# Patient Record
Sex: Female | Born: 1980 | Race: White | Hispanic: No | Marital: Married | Smoking: Never smoker
Health system: Southern US, Community
[De-identification: ages and names within clinical notes are randomized; demographics above are authoritative.]

## PROBLEM LIST (undated history)

## (undated) DIAGNOSIS — J40 Bronchitis, not specified as acute or chronic: Secondary | ICD-10-CM

## (undated) DIAGNOSIS — E039 Hypothyroidism, unspecified: Secondary | ICD-10-CM

## (undated) HISTORY — PX: TONSILLECTOMY: SUR1361

## (undated) HISTORY — PX: WISDOM TOOTH EXTRACTION: SHX21

---

## 2004-01-05 ENCOUNTER — Encounter: Payer: Self-pay | Admitting: Pulmonary Disease

## 2008-09-29 ENCOUNTER — Ambulatory Visit (HOSPITAL_COMMUNITY): Admission: RE | Admit: 2008-09-29 | Discharge: 2008-09-29 | Payer: Self-pay | Admitting: Obstetrics and Gynecology

## 2008-11-03 ENCOUNTER — Ambulatory Visit: Payer: Self-pay | Admitting: Pulmonary Disease

## 2008-11-03 DIAGNOSIS — J45909 Unspecified asthma, uncomplicated: Secondary | ICD-10-CM | POA: Insufficient documentation

## 2008-11-03 DIAGNOSIS — J309 Allergic rhinitis, unspecified: Secondary | ICD-10-CM | POA: Insufficient documentation

## 2008-11-10 ENCOUNTER — Telehealth: Payer: Self-pay | Admitting: Pulmonary Disease

## 2008-11-14 ENCOUNTER — Telehealth (INDEPENDENT_AMBULATORY_CARE_PROVIDER_SITE_OTHER): Payer: Self-pay | Admitting: *Deleted

## 2008-11-15 ENCOUNTER — Ambulatory Visit: Payer: Self-pay | Admitting: Internal Medicine

## 2008-11-15 ENCOUNTER — Encounter: Payer: Self-pay | Admitting: Adult Health

## 2008-12-04 ENCOUNTER — Ambulatory Visit: Payer: Self-pay | Admitting: Pulmonary Disease

## 2009-01-31 ENCOUNTER — Ambulatory Visit: Payer: Self-pay | Admitting: Pulmonary Disease

## 2009-04-11 ENCOUNTER — Ambulatory Visit: Payer: Self-pay | Admitting: Pulmonary Disease

## 2009-04-11 DIAGNOSIS — R05 Cough: Secondary | ICD-10-CM | POA: Insufficient documentation

## 2009-05-12 ENCOUNTER — Inpatient Hospital Stay (HOSPITAL_COMMUNITY): Admission: AD | Admit: 2009-05-12 | Discharge: 2009-05-14 | Payer: Self-pay | Admitting: Obstetrics and Gynecology

## 2009-05-12 ENCOUNTER — Encounter (INDEPENDENT_AMBULATORY_CARE_PROVIDER_SITE_OTHER): Payer: Self-pay | Admitting: Obstetrics and Gynecology

## 2009-05-18 ENCOUNTER — Ambulatory Visit: Admission: RE | Admit: 2009-05-18 | Discharge: 2009-05-18 | Payer: Self-pay | Admitting: Obstetrics and Gynecology

## 2009-07-11 ENCOUNTER — Ambulatory Visit: Payer: Self-pay | Admitting: Pulmonary Disease

## 2009-07-16 ENCOUNTER — Telehealth: Payer: Self-pay | Admitting: Pulmonary Disease

## 2009-07-31 ENCOUNTER — Ambulatory Visit: Payer: Self-pay | Admitting: Pulmonary Disease

## 2009-10-19 ENCOUNTER — Ambulatory Visit: Payer: Self-pay | Admitting: Physician Assistant

## 2009-10-19 ENCOUNTER — Inpatient Hospital Stay (HOSPITAL_COMMUNITY): Admission: AD | Admit: 2009-10-19 | Discharge: 2009-10-19 | Payer: Self-pay | Admitting: Obstetrics and Gynecology

## 2011-03-10 LAB — CBC
Hemoglobin: 10.1 g/dL — ABNORMAL LOW (ref 12.0–15.0)
Hemoglobin: 14.1 g/dL (ref 12.0–15.0)
Platelets: 114 10*3/uL — ABNORMAL LOW (ref 150–400)
Platelets: 94 10*3/uL — ABNORMAL LOW (ref 150–400)
RBC: 2.9 MIL/uL — ABNORMAL LOW (ref 3.87–5.11)
WBC: 8.4 10*3/uL (ref 4.0–10.5)
WBC: 9.1 10*3/uL (ref 4.0–10.5)

## 2011-04-15 NOTE — Discharge Summary (Signed)
[name], [name]            ACCOUNT NO.:  [id number]   MEDICAL RECORD NO.:  [id number]          PATIENT TYPE:  INP   LOCATION:  9114                          FACILITY:  [facility]   PHYSICIAN:  Malachi Pro. Ambrose Mantle, M.D. DATE OF BIRTH:  04/12/1981   DATE OF ADMISSION:  05/12/2009  DATE OF DISCHARGE:  05/14/2009                               DISCHARGE SUMMARY   HISTORY:  A 30 year old white married female, para 0, gravida 1, EDC on  May 18, 2009, admitted with ruptured membranes and active labor.  Blood  group and type O positive, negative antibody, nonreactive serology,  rubella immune, hepatitis B surface antigen negative, HIV negative, GC  and chlamydia negative, quad screen negative, 1-hour Glucola 79, and  group B strep positive.  Vaginal ultrasound on October 17, 2008, crown-  rump length 2.71 cm, 9 weeks 4 days, EDC on May 18, 2009.  Ultrasound  on December 25, 2008, compatible with date, normal anatomy.  Prenatal  care was uncomplicated except for asthmatic bronchitis.  The patient  awoke at approximately 2 a.m., went to the restroom and had spontaneous  rupture of membranes, she began contracting, and came here for  evaluation.   PAST MEDICAL HISTORY:  No known allergies.   OPERATIONS:  In 1989 a T and A.  In 2001, wisdom teeth extracted.   ILLNESSES:  Depression.   SOCIAL HISTORY:  Alcohol, tobacco, and drugs none.   FAMILY HISTORY:  Paternal grandmother with high blood pressure and heart  disease.  Maternal grandmother with thyroid problem.  Maternal  grandfather with diabetes.  Maternal grandfather with brain cancer.   On admission, her vital signs were normal.  Heart and lungs were normal.  The abdomen was soft.  Fundal height 38 cm on May 02, 2009, fetal heart  tones were normal.  Cervix was 8 cm at 5:10 a.m., 100% vertex, at a 0  station.  Because of a late admission with the cervix already dilated to  8 cm and positive group B strep, the patient was treated with  IV  ampicillin.  She received an epidural and progressed to full dilatation.  She made some slow steady progress with pushing, but there was  bradycardia with each contraction  with 6 cm of caput showing, a kiwi  self-controlled vacuum was applied and with 3 contractions and no pop  offs, a living 6 pounds 11 ounces female infant, Apgars of 8 and 1 and 9  at 5 minutes, was delivered LOA through a tight nuchal cord.  Placenta  was slow to release in a large portion that appeared calcified or  infarct.  The second-degree midline laceration was repaired with 3-0  Vicryl, but the repair was interrupted several times to massage the  uterus in order to try to get a field unencumbered by blood.  In spite  of multiple evacuations of the uterus and continued to bleed, so  Hemabate was considered, but not administered because of her asthma.  Methergine 0.2 mg IM was given.  Rectal was negative.  Bleeding appeared  except end of the procedure.  All the bleeding was originating  from the  uterus.  Estimated blood loss was 1000-1200 mL.   Postpartum, the patient's blood pressure intermittently rose above  140/90, but for the most part remained completely normal.  She did not  have any headache.  On the second postpartum day, she was ready for  discharge.  The initial hemoglobin was 14.1, hematocrit 39.2, white  count 91,100, and platelet count of 114,000.  Followup hemoglobin 10.1,  platelet count 94,000, and RPR nonreactive.   FINAL DIAGNOSES:  Intrauterine pregnancy at 39 weeks, delivered left  occipitoanterior, thrombocytopenia.   OPERATION:  Vacuum-assisted vaginal delivery left occipitoanterior,  repair of second-degree midline laceration, intermittent hypertension.   The patient is given our discharge instruction booklet.  She declines  analgesics for discharge.  She is to continue her prenatal vitamins,  Protonix, Zyrtec, and Robitussin.  She is also advised to take ferrous  sulfate 325 mg  twice a day, Motrin 200 mg at home as needed.  She is  also advised to have a repeat CBC at 6 weeks checkup and to take her  blood pressure herself and call if it is consistently elevated greater  than 140/90, especially if she has associated headache.      Malachi Pro. Ambrose Mantle, M.D.  Electronically Signed     TFH/MEDQ  D:  05/14/2009  T:  05/14/2009  Job:  [id number]

## 2011-09-03 ENCOUNTER — Encounter: Payer: Self-pay | Admitting: Pulmonary Disease

## 2011-09-04 ENCOUNTER — Ambulatory Visit: Payer: 59

## 2011-09-04 ENCOUNTER — Encounter: Payer: Self-pay | Admitting: Pulmonary Disease

## 2011-09-04 ENCOUNTER — Ambulatory Visit (INDEPENDENT_AMBULATORY_CARE_PROVIDER_SITE_OTHER): Payer: 59 | Admitting: Pulmonary Disease

## 2011-09-04 DIAGNOSIS — J45909 Unspecified asthma, uncomplicated: Secondary | ICD-10-CM

## 2011-09-04 DIAGNOSIS — R05 Cough: Secondary | ICD-10-CM

## 2011-09-04 DIAGNOSIS — Z0000 Encounter for general adult medical examination without abnormal findings: Secondary | ICD-10-CM

## 2011-09-04 MED ORDER — ALBUTEROL SULFATE HFA 108 (90 BASE) MCG/ACT IN AERS: 2.0000 | INHALATION_SPRAY | Freq: Four times a day (QID) | RESPIRATORY_TRACT | Status: AC | PRN

## 2011-09-04 MED ORDER — FLUTICASONE-SALMETEROL 250-50 MCG/DOSE IN AEPB: 1.0000 | INHALATION_SPRAY | Freq: Two times a day (BID) | RESPIRATORY_TRACT | Status: DC

## 2011-09-04 NOTE — Progress Notes (Signed)
  Subjective:    Patient ID: [name], female    DOB: 22-Jun-1981, 30 y.o.   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  spirometry 12/09 >> no obstruction, restriction ? related to pregnancy.  she had a similar episode in 2007 when she was evaluated at [facility] with-CT scans & lung function testing. Allergy testing >> to pollen & grass.  Chronic cough has responded somewhat to protonix & claritin - tussionex as needed only.    July 31, 2009  Stopped milk & chocolate 2 weeks ago because baby was 'fussy' - cough disappeared & breathing improved. Resumed chocolate intake without issues, no h/o lactose intolerance as a child, moved to new home in 10/09 with carpets. Off protonix, GERD better  09/04/11 60yr FU C/o  constant cough . Seems to be worse at fall time.has had a course of abx and predisone dosepak. RAST neg   Review of Systems Pt denies any significant  nasal congestion or excess secretions, fever, chills, sweats, unintended wt loss, pleuritic or exertional cp, orthopnea pnd or leg swelling.  Pt also denies any obvious fluctuation in symptoms with weather or environmental change or other alleviating or aggravating factors.    Pt denies any increase in rescue therapy over baseline, denies waking up needing it or having early am exacerbations or coughing/wheezing/ or dyspnea      Objective:   Physical Exam Gen. Pleasant, well-nourished, in no distress ENT - no lesions, no post nasal drip Neck: No JVD, no thyromegaly, no carotid bruits Lungs: no use of accessory muscles, no dullness to percussion, clear without rales or rhonchi  Cardiovascular: Rhythm regular, heart sounds  normal, no murmurs or gallops, no peripheral edema Musculoskeletal: No deformities, no cyanosis or clubbing         Assessment & Plan:

## 2011-09-04 NOTE — Patient Instructions (Addendum)
Allergy testing - RAST Get back on advair 250/50 - sample & Rx - RINSE mouth after use Albuterol 2 puffs MDI as needed only for RESCUE Cal for prednisone Rx if no better in 1 week or impedes work

## 2011-09-05 LAB — ALLERGY FULL PROFILE
Allergen,Goose feathers, e70: <0.1 kU/L (ref <0.35)
Alternaria Alternata: <0.1 kU/L (ref <0.35)
Bahia Grass: <0.1 kU/L (ref <0.35)
Box Elder IgE: <0.1 kU/L (ref <0.35)
Cat Dander: <0.1 kU/L (ref <0.35)
Common Ragweed: <0.1 kU/L (ref <0.35)
D. farinae: <0.1 kU/L (ref <0.35)
Elm IgE: <0.1 kU/L (ref <0.35)
Helminthosporium halodes: <0.1 kU/L (ref <0.35)
House Dust Hollister: <0.1 kU/L (ref <0.35)
IgE (Immunoglobulin E), Serum: <1.5 IU/mL (ref 0.0–180.0)
Lamb's Quarters: <0.1 kU/L (ref <0.35)
Sycamore Tree: <0.1 kU/L (ref <0.35)

## 2011-09-06 ENCOUNTER — Encounter: Payer: Self-pay | Admitting: Pulmonary Disease

## 2011-09-06 NOTE — Assessment & Plan Note (Signed)
Get back on advair 250/50 - sample & Rx - RINSE mouth after use Albuterol 2 puffs MDI as needed only for RESCUE Cal for prednisone Rx if no better in 1 week or impedes work

## 2011-09-08 ENCOUNTER — Telehealth: Payer: Self-pay | Admitting: Pulmonary Disease

## 2011-09-08 MED ORDER — FLUTICASONE-SALMETEROL 250-50 MCG/DOSE IN AEPB: 1.0000 | INHALATION_SPRAY | Freq: Two times a day (BID) | RESPIRATORY_TRACT | Status: DC

## 2011-09-08 NOTE — Telephone Encounter (Signed)
I spoke with pt and she states she needed her advair inhaler sent to [facility] outpatient pharmacy #90 day supply. I advised will send rx and nothing further was needed

## 2011-09-09 NOTE — Progress Notes (Signed)
Quick Note:  Spoke with pt and notified of results per RA. She verbalized understanding. ______

## 2011-10-03 ENCOUNTER — Ambulatory Visit: Payer: 59 | Admitting: Adult Health

## 2011-12-17 ENCOUNTER — Encounter: Payer: Self-pay | Admitting: Adult Health

## 2011-12-17 ENCOUNTER — Ambulatory Visit (INDEPENDENT_AMBULATORY_CARE_PROVIDER_SITE_OTHER): Payer: 59 | Admitting: Adult Health

## 2011-12-17 VITALS — BP 100/72 | HR 80 | Temp 96.9°F | Ht 66.0 in | Wt 177.2 lb

## 2011-12-17 DIAGNOSIS — J45909 Unspecified asthma, uncomplicated: Secondary | ICD-10-CM

## 2011-12-17 MED ORDER — FLUTICASONE-SALMETEROL 100-50 MCG/DOSE IN AEPB: 1.0000 | INHALATION_SPRAY | Freq: Two times a day (BID) | RESPIRATORY_TRACT | Status: DC

## 2011-12-17 NOTE — Patient Instructions (Signed)
May decrease Advair 100/50 1 puff Twice daily   follow up Dr. Vassie Loll  In 4 months and As needed

## 2011-12-17 NOTE — Progress Notes (Signed)
  Subjective:    Patient ID: [name], female    DOB: 12/25/1980, 31 y.o.   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  spirometry 12/09 >> no obstruction, restriction ? related to pregnancy.  she had a similar episode in 2007 when she was evaluated at [facility] with-CT scans & lung function testing. Allergy testing >> to pollen & grass.  Chronic cough has responded somewhat to protonix & claritin - tussionex as needed only.    July 31, 2009  Stopped milk & chocolate 2 weeks ago because baby was 'fussy' - cough disappeared & breathing improved. Resumed chocolate intake without issues, no h/o lactose intolerance as a child, moved to new home in 10/09 with carpets. Off protonix, GERD better  09/04/11 73yr FU C/o  constant cough . Seems to be worse at fall time.has had a course of abx and predisone dosepak. RAST neg >no changes   12/17/2011 Follow up  Pt returns for follow up. Doing very well on Advair . No flare since last ov. No SABA use.  No ER or hospital visits.  No increased cough or dyspnea. No wheeizng.     Review of Systems  Constitutional:   No  weight loss, night sweats,  Fevers, chills, fatigue, or  lassitude.  HEENT:   No headaches,  Difficulty swallowing,  Tooth/dental problems, or  Sore throat,                No sneezing, itching, ear ache, nasal congestion, post nasal drip,   CV:  No chest pain,  Orthopnea, PND, swelling in lower extremities, anasarca, dizziness, palpitations, syncope.   GI  No heartburn, indigestion, abdominal pain, nausea, vomiting, diarrhea, change in bowel habits, loss of appetite, bloody stools.   Resp: No shortness of breath with exertion or at rest.  No excess mucus, no productive cough,  No non-productive cough,  No coughing up of blood.  No change in color of mucus.  No wheezing.  No chest wall deformity  Skin: no rash or lesions.  GU: no dysuria,  change in color of urine, no urgency or frequency.  No flank pain, no hematuria   MS:  No joint pain or swelling.  No decreased range of motion.  No back pain.  Psych:  No change in mood or affect. No depression or anxiety.  No memory loss.        Objective:   Physical Exam  Gen. Pleasant, well-nourished, in no distress ENT - no lesions, no post nasal drip Neck: No JVD, no thyromegaly, no carotid bruits Lungs: no use of accessory muscles, no dullness to percussion, clear without rales or rhonchi  Cardiovascular: Rhythm regular, heart sounds  normal, no murmurs or gallops, no peripheral edema Musculoskeletal: No deformities, no cyanosis or clubbing         Assessment & Plan:

## 2011-12-17 NOTE — Assessment & Plan Note (Signed)
Compensated on present regimen Will step down therapy  Plan;  Decrease Advair 100/50 1 puff Twice daily   follow up Dr. Vassie Loll  In 4 months

## 2012-03-07 ENCOUNTER — Emergency Department (HOSPITAL_COMMUNITY)
Admission: EM | Admit: 2012-03-07 | Discharge: 2012-03-07 | Disposition: A | Discharge status: Home / Self Care | Payer: 59 | Source: Home / Self Care | Attending: Emergency Medicine | Admitting: Emergency Medicine

## 2012-03-07 ENCOUNTER — Encounter (HOSPITAL_COMMUNITY): Payer: Self-pay | Admitting: *Deleted

## 2012-03-07 DIAGNOSIS — J45909 Unspecified asthma, uncomplicated: Secondary | ICD-10-CM

## 2012-03-07 DIAGNOSIS — J329 Chronic sinusitis, unspecified: Secondary | ICD-10-CM

## 2012-03-07 HISTORY — DX: Bronchitis, not specified as acute or chronic: J40

## 2012-03-07 MED ORDER — FEXOFENADINE-PSEUDOEPHED ER 60-120 MG PO TB12: 1.0000 | ORAL_TABLET | Freq: Two times a day (BID) | ORAL | Status: DC

## 2012-03-07 MED ORDER — PREDNISONE 20 MG PO TABS: 40.0000 mg | ORAL_TABLET | Freq: Every day | ORAL | Status: AC

## 2012-03-07 MED ORDER — AMOXICILLIN 500 MG PO CAPS: 500.0000 mg | ORAL_CAPSULE | Freq: Three times a day (TID) | ORAL | Status: AC

## 2012-03-07 NOTE — Discharge Instructions (Signed)
  As discussed start with antihistamines and prednisone and continue with albuterol use as needed if no improvement within the next 4872 hours and you persists with sinus pressure and congestion start with provided antibiotic   Asthma, Adult Asthma is caused by narrowing of the air passages in the lungs. It may be triggered by pollen, dust, animal dander, molds, some foods, respiratory infections, exposure to smoke, exercise, emotional stress or other allergens (things that cause allergic reactions or allergies). Repeat attacks are common. HOME CARE INSTRUCTIONS   Use prescription medications as ordered by your caregiver.   Avoid pollen, dust, animal dander, molds, smoke and other things that cause attacks at home and at work.   You may have fewer attacks if you decrease dust in your home. Electrostatic air cleaners may help.   It may help to replace your pillows or mattress with materials less likely to cause allergies.   Talk to your caregiver about an action plan for managing asthma attacks at home, including, the use of a peak flow meter which measures the severity of your asthma attack. An action plan can help minimize or stop the attack without having to seek medical care.   If you are not on a fluid restriction, drink 8 to 10 glasses of water each day.   Always have a plan prepared for seeking medical attention, including, calling your physician, accessing local emergency care, and calling 911 (in the [place] for a severe attack.   Discuss possible exercise routines with your caregiver.   If animal dander is the cause of asthma, you may need to get rid of pets.  SEEK MEDICAL CARE IF:   You have wheezing and shortness of breath even if taking medicine to prevent attacks.   You have muscle aches, chest pain or thickening of sputum.   Your sputum changes from clear or white to yellow, green, gray, or bloody.   You have any problems that may be related to the medicine you are taking  (such as a rash, itching, swelling or trouble breathing).  SEEK IMMEDIATE MEDICAL CARE IF:   Your usual medicines do not stop your wheezing or there is increased coughing and/or shortness of breath.   You have increased difficulty breathing.   You have a fever.  MAKE SURE YOU:   Understand these instructions.   Will watch your condition.   Will get help right away if you are not doing well or get worse.  Document Released: 11/17/2005 Document Revised: 11/06/2011 Document Reviewed: 07/05/2008 [facility] Patient Information 2012 [place], [place].

## 2012-03-07 NOTE — ED Provider Notes (Signed)
History     CSN: [id number]  Arrival date & time 03/07/12  1732   First MD Initiated Contact with Patient 03/07/12 1738      Chief Complaint  Patient presents with  . Nasal Congestion  . Cough  . Chills    (Consider location/radiation/quality/duration/timing/severity/associated sxs/prior treatment) HPI Comments: Since Thursday my symptoms have worsened, shortness of breath wheezing congestion cough and sinus pressure have been coughing up abundant phlegm and green-looking at times sinus pressure has been increasing. Have been using albuterol but wheezing and tightness keeps coming back.  Patient is a 31 y.o. female presenting with cough. The history is provided by the patient.  Cough This is a new problem. The current episode started more than 1 week ago. The problem occurs constantly. The problem has not changed since onset.The cough is productive of sputum. Associated symptoms include chills, rhinorrhea, sore throat, shortness of breath and wheezing. Pertinent negatives include no chest pain and no ear pain. She has tried nothing for the symptoms. The treatment provided no relief. Her past medical history is significant for asthma.    Past Medical History  Diagnosis Date  . Allergic rhinitis   . Asthma   . Bronchitis   . Tension headache     Past Surgical History  Procedure Date  . Wisdom tooth extraction   . Tonsillectomy     History reviewed. No pertinent family history.  History  Substance Use Topics  . Smoking status: Never Smoker   . Smokeless tobacco: Never Used  . Alcohol Use: No    OB History    Grav Para Term Preterm Abortions TAB SAB Ect Mult Living                  Review of Systems  Constitutional: Positive for fever, chills, appetite change and fatigue.  HENT: Positive for congestion, sore throat, rhinorrhea and sinus pressure. Negative for ear pain, trouble swallowing, neck pain, neck stiffness and voice change.   Respiratory: Positive for cough,  shortness of breath and wheezing.   Cardiovascular: Negative for chest pain.  Gastrointestinal: Negative for abdominal pain.  Skin: Negative for rash.    Allergies  Review of patient's allergies indicates no known allergies.  Home Medications   Current Outpatient Rx  Name Route Sig Dispense Refill  . ALBUTEROL SULFATE HFA 108 (90 BASE) MCG/ACT IN AERS Inhalation Inhale 2 puffs into the lungs every 6 (six) hours as needed for wheezing. 1 Inhaler 5  . CYCLOBENZAPRINE HCL 5 MG PO TABS Oral Take 5 mg by mouth 2 (two) times daily as needed. Patient takes this prn headache     . ESCITALOPRAM OXALATE 10 MG PO TABS Oral Take 10 mg by mouth daily.      Marland Kitchen FLUTICASONE-SALMETEROL 100-50 MCG/DOSE IN AEPB Inhalation Inhale 1 puff into the lungs 2 (two) times daily. 1 each 5  . ALEVE-D SINUS & COLD PO Oral Take by mouth.    . AMOXICILLIN 500 MG PO CAPS Oral Take 1 capsule (500 mg total) by mouth 3 (three) times daily. 30 capsule 0  . ETONOGESTREL-ETHINYL ESTRADIOL 0.12-0.015 MG/24HR VA RING Vaginal Place 1 each vaginally every 28 (twenty-eight) days. Insert vaginally and leave in place for 3 consecutive weeks, then remove for 1 week.    Marland Kitchen FEXOFENADINE-PSEUDOEPHED ER 60-120 MG PO TB12 Oral Take 1 tablet by mouth every 12 (twelve) hours. 15 tablet 0  . PREDNISONE 20 MG PO TABS Oral Take 2 tablets (40 mg total) by mouth daily.  2 tablets daily for 5 days 10 tablet 0    BP 120/79  Pulse 98  Temp(Src) 98.7 F (37.1 C) (Oral)  Resp 18  SpO2 99%  LMP 02/09/2012  Physical Exam  Nursing note and vitals reviewed. Constitutional: She appears well-developed and well-nourished.  HENT:  Head: Normocephalic.  Nose: Rhinorrhea present.  Mouth/Throat: Uvula is midline. Posterior oropharyngeal erythema present.  Eyes: Conjunctivae are normal. No scleral icterus.  Neck: Neck supple. No JVD present.  Cardiovascular: Normal rate.   Pulmonary/Chest: Effort normal. No respiratory distress. She has wheezes. She  has no rales. She exhibits no tenderness.  Abdominal: Soft. There is no tenderness.  Lymphadenopathy:    She has no cervical adenopathy.  Neurological: She is alert.  Skin: No rash noted. No erythema.    ED Course  Procedures (including critical care time)  Labs Reviewed - No data to display No results found.   1. RAD (reactive airway disease)   2. Sinusitis       MDM  Patient been expressing respiratory symptoms include cough congestion wheezing chills and sinus pressure since Thursday they have exacerbated but he been having the symptoms for about 7 days he is chronically on aspirin maintenance medication and is using albuterol as breakthrough        Jimmie Molly, MD 03/07/12 [phone]

## 2012-03-07 NOTE — ED Notes (Signed)
Pt with history bronchitis and asthma onset of congestion/cough/chills Thursday -aching all over -

## 2013-02-08 ENCOUNTER — Telehealth: Payer: Self-pay | Admitting: Pulmonary Disease

## 2013-02-08 NOTE — Telephone Encounter (Signed)
Pt return call. Please call back at [phone]. [name]

## 2013-02-08 NOTE — Telephone Encounter (Signed)
lmtcb x1 for pt. 

## 2013-02-08 NOTE — Telephone Encounter (Signed)
ATC patient, no answer [id number] 

## 2013-02-09 MED ORDER — FLUTICASONE-SALMETEROL 100-50 MCG/DOSE IN AEPB: 1.0000 | INHALATION_SPRAY | Freq: Two times a day (BID) | RESPIRATORY_TRACT | Status: DC

## 2013-02-09 NOTE — Telephone Encounter (Signed)
Patient returning call.

## 2013-02-09 NOTE — Telephone Encounter (Signed)
I spoke with pt and is aware will send in refill to last her until she comes in for her OV. Pt voiced her understanding. RX has been sent. Nothing further was needed

## 2013-02-09 NOTE — Telephone Encounter (Signed)
[facility] to get refill of advair - wants 90 days. Caller says she has faxed on 3 separate days for this. Hazel Sams

## 2013-02-24 ENCOUNTER — Encounter: Payer: Self-pay | Admitting: Adult Health

## 2013-02-24 ENCOUNTER — Ambulatory Visit (INDEPENDENT_AMBULATORY_CARE_PROVIDER_SITE_OTHER): Payer: 59 | Admitting: Adult Health

## 2013-02-24 VITALS — BP 108/62 | HR 75 | Temp 97.8°F | Ht 66.0 in | Wt 220.6 lb

## 2013-02-24 DIAGNOSIS — J45909 Unspecified asthma, uncomplicated: Secondary | ICD-10-CM

## 2013-02-24 NOTE — Assessment & Plan Note (Addendum)
Moderate persistent asthma with frequent exacerbations over the last year. Appears to have allergic rhinitis with mild flare  Plan  Increase Advair 250/50 1 puff Twice daily  , brush , rinse and gargle after use.  Begin Zyrtec 10mg  At bedtime  At least for next several weeks, then As needed   follow up Dr. Vassie Loll  In 6 months and As needed

## 2013-02-24 NOTE — Patient Instructions (Addendum)
Increase Advair 250/50 1 puff Twice daily  , brush , rinse and gargle after use.  Begin Zyrtec 10mg  At bedtime  At least for next several weeks, then As needed   follow up Dr. Vassie Loll  In 6 months and As needed

## 2013-02-24 NOTE — Progress Notes (Signed)
  Subjective:    Patient ID: [name], female    DOB: 07-29-1981, 32 y.o.   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  spirometry 12/09 >> no obstruction, restriction ? related to pregnancy.  she had a similar episode in 2007 when she was evaluated at [facility] with-CT scans & lung function testing. Allergy testing >> to pollen & grass.  Chronic cough has responded somewhat to protonix & claritin - tussionex as needed only.    July 31, 2009  Stopped milk & chocolate 2 weeks ago because baby was 'fussy' - cough disappeared & breathing improved. Resumed chocolate intake without issues, no h/o lactose intolerance as a child, moved to new home in 10/09 with carpets. Off protonix, GERD better  09/04/11 48yr FU C/o  constant cough . Seems to be worse at fall time.has had a course of abx and predisone dosepak. RAST neg >no changes   12/17/11  Follow up  Pt returns for follow up. Doing very well on Advair . No flare since last ov. No SABA use.  No ER or hospital visits.  No increased cough or dyspnea. No wheeizng.  >no changes   02/24/2013 Follow up  yearly follow up for med refills Has seen PCP twice this year for wheezing and received pred pak x2. Seen in urgent care x 1 with pred pack . >3 flares over last year.  Has 1 child at home.  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  Does have some nasal drainage at times.  No fever, chest pain , edema.  Increased SABA use during flares.  Baseline no SABA use .    Review of Systems  Constitutional:   No  weight loss, night sweats,  Fevers, chills, fatigue, or  lassitude.  HEENT:   No headaches,  Difficulty swallowing,  Tooth/dental problems, or  Sore throat,                No sneezing, itching, ear ache,  +nasal congestion, post nasal drip,   CV:  No chest pain,  Orthopnea, PND, swelling in lower extremities, anasarca, dizziness, palpitations, syncope.    GI  No heartburn, indigestion, abdominal pain, nausea, vomiting, diarrhea, change in bowel habits, loss of appetite, bloody stools.   Resp: No shortness of breath with exertion or at rest.  No excess mucus, no productive cough,  No non-productive cough,  No coughing up of blood.  No change in color of mucus.  No wheezing.  No chest wall deformity  Skin: no rash or lesions.  GU: no dysuria, change in color of urine, no urgency or frequency.  No flank pain, no hematuria   MS:  No joint pain or swelling.  No decreased range of motion.  No back pain.  Psych:  No change in mood or affect. No depression or anxiety.  No memory loss.        Objective:   Physical Exam  Gen. Pleasant, well-nourished, in no distress ENT - no lesions, no post nasal drip Neck: No JVD, no thyromegaly, no carotid bruits Lungs: no use of accessory muscles, no dullness to percussion, clear without rales or rhonchi  Cardiovascular: Rhythm regular, heart sounds  normal, no murmurs or gallops, no peripheral edema Musculoskeletal: No deformities, no cyanosis or clubbing         Assessment & Plan:

## 2013-02-25 ENCOUNTER — Ambulatory Visit: Payer: 59 | Admitting: Adult Health

## 2013-03-02 ENCOUNTER — Telehealth: Payer: Self-pay | Admitting: Adult Health

## 2013-03-02 MED ORDER — FLUTICASONE-SALMETEROL 250-50 MCG/DOSE IN AEPB: 1.0000 | INHALATION_SPRAY | Freq: Two times a day (BID) | RESPIRATORY_TRACT | Status: DC

## 2013-03-02 NOTE — Telephone Encounter (Signed)
rx called in. Carron Curie, CMA

## 2013-08-03 ENCOUNTER — Telehealth: Payer: Self-pay | Admitting: Pulmonary Disease

## 2013-08-03 NOTE — Telephone Encounter (Signed)
left messages for pt to call back to schedule follow up apt. No return calls back. Sent letter 08/03/13 ° °

## 2013-08-23 ENCOUNTER — Telehealth: Payer: Self-pay | Admitting: Pulmonary Disease

## 2013-08-23 MED ORDER — FLUTICASONE-SALMETEROL 250-50 MCG/DOSE IN AEPB: 1.0000 | INHALATION_SPRAY | Freq: Two times a day (BID) | RESPIRATORY_TRACT | Status: DC

## 2013-08-23 NOTE — Telephone Encounter (Signed)
ATC patient no answer Left detailed msg on VM as requested-- Rx has been eRx to [facility]

## 2013-10-10 ENCOUNTER — Encounter (INDEPENDENT_AMBULATORY_CARE_PROVIDER_SITE_OTHER): Payer: Self-pay

## 2013-10-10 ENCOUNTER — Encounter: Payer: Self-pay | Admitting: Pulmonary Disease

## 2013-10-10 ENCOUNTER — Ambulatory Visit (INDEPENDENT_AMBULATORY_CARE_PROVIDER_SITE_OTHER): Payer: 59 | Admitting: Pulmonary Disease

## 2013-10-10 VITALS — BP 108/66 | HR 79 | Ht 66.0 in | Wt 206.4 lb

## 2013-10-10 DIAGNOSIS — J45909 Unspecified asthma, uncomplicated: Secondary | ICD-10-CM

## 2013-10-10 DIAGNOSIS — J309 Allergic rhinitis, unspecified: Secondary | ICD-10-CM

## 2013-10-10 MED ORDER — AZITHROMYCIN 250 MG PO TABS: ORAL_TABLET | ORAL | Status: DC

## 2013-10-10 NOTE — Patient Instructions (Signed)
[facility] Rx to [facility] in [place] Claritin during spring & fall Spirometry next visit & can step down dose of advair Take [facility] brand decongestant x 1 week

## 2013-10-10 NOTE — Progress Notes (Signed)
  Subjective:    Patient ID: [name], female    DOB: 06/09/1981, 32 y.o.   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 , never smoker for FU of asthmatic bronchitis  Intially seen 11/03/08.  spirometry 12/09 >> no obstruction, restriction ? related to pregnancy.  she had a similar episode in 2007 when she was evaluated at [facility] with-CT scans & lung function testing.  Allergy testing >>  pollen & grass.  09/04/11 RAST neg  Chronic cough has responded somewhat to protonix & claritin - tussionex as needed only.   July 31, 2009  Stopped milk & chocolate 2 weeks ago because baby was 'fussy' - cough disappeared & breathing improved. Resumed chocolate intake without issues, no h/o lactose intolerance as a child, moved to new home in 10/09 with carpets. Off protonix, GERD better     10/10/2013 79m FU 2-3 flares in 2014 >3 flares over 2013  Chief Complaint  Patient presents with  . Follow-up    Pt reports her breathing is okay. C/o HA, runny nose, chest congestion, productive cough-green phlem, PND, watery/itchy eyes. She uses zyrtec daily.    Baseline no SABA use . No nocturnal wheezing or SABA use    Review of Systems neg for any significant sore throat, dysphagia, itching, sneezing, nasal congestion or excess/ purulent secretions, fever, chills, sweats, unintended wt loss, pleuritic or exertional cp, hempoptysis, orthopnea pnd or change in chronic leg swelling. Also denies presyncope, palpitations, heartburn, abdominal pain, nausea, vomiting, diarrhea or change in bowel or urinary habits, dysuria,hematuria, rash, arthralgias, visual complaints, headache, numbness weakness or ataxia.     Objective:   Physical Exam  Gen. Pleasant, well-nourished, in no distress ENT - no lesions, no post nasal drip Neck: No JVD, no thyromegaly, no carotid bruits Lungs: no use of accessory muscles, no dullness to percussion, clear without rales or rhonchi   Cardiovascular: Rhythm regular, heart sounds  normal, no murmurs or gallops, no peripheral edema Musculoskeletal: No deformities, no cyanosis or clubbing        Assessment & Plan:

## 2013-10-13 NOTE — Assessment & Plan Note (Signed)
ZPAK for acute bronchitis Spirometry next visit & can step down dose of advair

## 2013-10-13 NOTE — Assessment & Plan Note (Signed)
Claritin during spring & fall Take [facility] brand decongestant x 1 week

## 2014-04-10 ENCOUNTER — Ambulatory Visit: Payer: 59 | Admitting: Adult Health

## 2014-05-03 ENCOUNTER — Ambulatory Visit: Payer: 59 | Admitting: Adult Health

## 2016-12-01 NOTE — L&D Delivery Note (Signed)
Delivery Note At 5:54 AM a viable female was delivered via Vaginal, Spontaneous Delivery (Presentation:OA with left compund arm presentation- delivered across chest ;  ).  APGAR:8 9 , ; weight pending  .   Placenta status: intact, schultz, .  Cord: 3vc  with the following complications: none.    Anesthesia: epidural  Episiotomy: None Lacerations: 1st degree;Perineal Suture Repair: 2.0 vicryl Est. Blood Loss (mL): 250  Mom to postpartum.  Baby to Couplet care / Skin to Skin  Pt would like a circumcision while in hospital- discussed.  Cathrine MusterCecilia W Banga 07/09/2017, 6:14 AM

## 2016-12-18 LAB — OB RESULTS CONSOLE RUBELLA ANTIBODY, IGM: RUBELLA: IMMUNE

## 2016-12-18 LAB — OB RESULTS CONSOLE GC/CHLAMYDIA
CHLAMYDIA, DNA PROBE: NEGATIVE
GC PROBE AMP, GENITAL: NEGATIVE

## 2016-12-18 LAB — OB RESULTS CONSOLE ANTIBODY SCREEN: Antibody Screen: NEGATIVE

## 2016-12-18 LAB — OB RESULTS CONSOLE HIV ANTIBODY (ROUTINE TESTING): HIV: NONREACTIVE

## 2016-12-18 LAB — OB RESULTS CONSOLE HEPATITIS B SURFACE ANTIGEN: Hepatitis B Surface Ag: NEGATIVE

## 2016-12-18 LAB — OB RESULTS CONSOLE ABO/RH: RH Type: POSITIVE

## 2016-12-18 LAB — OB RESULTS CONSOLE RPR: RPR: NONREACTIVE

## 2017-02-26 ENCOUNTER — Other Ambulatory Visit (HOSPITAL_COMMUNITY): Payer: Self-pay | Admitting: Obstetrics and Gynecology

## 2017-02-26 DIAGNOSIS — Z3689 Encounter for other specified antenatal screening: Secondary | ICD-10-CM

## 2017-03-09 ENCOUNTER — Ambulatory Visit (HOSPITAL_COMMUNITY)
Admission: RE | Admit: 2017-03-09 | Discharge: 2017-03-09 | Disposition: A | Discharge status: Home / Self Care | Payer: PRIVATE HEALTH INSURANCE | Source: Ambulatory Visit | Attending: Obstetrics and Gynecology | Admitting: Obstetrics and Gynecology

## 2017-03-09 DIAGNOSIS — Z3689 Encounter for other specified antenatal screening: Secondary | ICD-10-CM | POA: Insufficient documentation

## 2017-03-09 DIAGNOSIS — Z3A21 21 weeks gestation of pregnancy: Secondary | ICD-10-CM | POA: Insufficient documentation

## 2017-03-24 ENCOUNTER — Other Ambulatory Visit (HOSPITAL_COMMUNITY): Payer: Self-pay | Admitting: Obstetrics and Gynecology

## 2017-04-07 ENCOUNTER — Other Ambulatory Visit (HOSPITAL_COMMUNITY): Payer: Self-pay | Admitting: Obstetrics and Gynecology

## 2017-04-07 DIAGNOSIS — Z3689 Encounter for other specified antenatal screening: Secondary | ICD-10-CM

## 2017-04-08 ENCOUNTER — Encounter (HOSPITAL_COMMUNITY): Payer: Self-pay | Admitting: *Deleted

## 2017-04-08 ENCOUNTER — Other Ambulatory Visit (HOSPITAL_COMMUNITY): Payer: Self-pay | Admitting: Obstetrics and Gynecology

## 2017-04-08 ENCOUNTER — Ambulatory Visit (HOSPITAL_COMMUNITY)
Admission: RE | Admit: 2017-04-08 | Discharge: 2017-04-08 | Disposition: A | Discharge status: Home / Self Care | Payer: PRIVATE HEALTH INSURANCE | Source: Ambulatory Visit | Attending: Obstetrics and Gynecology | Admitting: Obstetrics and Gynecology

## 2017-04-08 DIAGNOSIS — O99280 Endocrine, nutritional and metabolic diseases complicating pregnancy, unspecified trimester: Secondary | ICD-10-CM | POA: Insufficient documentation

## 2017-04-08 DIAGNOSIS — Z3A25 25 weeks gestation of pregnancy: Secondary | ICD-10-CM | POA: Insufficient documentation

## 2017-04-08 DIAGNOSIS — Z362 Encounter for other antenatal screening follow-up: Secondary | ICD-10-CM | POA: Insufficient documentation

## 2017-04-08 DIAGNOSIS — O09522 Supervision of elderly multigravida, second trimester: Secondary | ICD-10-CM | POA: Insufficient documentation

## 2017-04-08 DIAGNOSIS — E039 Hypothyroidism, unspecified: Secondary | ICD-10-CM | POA: Diagnosis not present

## 2017-04-08 DIAGNOSIS — Z3689 Encounter for other specified antenatal screening: Secondary | ICD-10-CM

## 2017-04-08 HISTORY — DX: Hypothyroidism, unspecified: E03.9

## 2017-04-08 NOTE — Addendum Note (Signed)
Encounter addended by: Genevie CheshireWaken, Reema Chick M, RT on: 04/08/2017  6:58 PM<BR>    Actions taken: Imaging Exam ended

## 2017-04-09 ENCOUNTER — Other Ambulatory Visit (HOSPITAL_COMMUNITY): Payer: Self-pay | Admitting: *Deleted

## 2017-04-09 DIAGNOSIS — IMO0002 Reserved for concepts with insufficient information to code with codable children: Secondary | ICD-10-CM

## 2017-04-09 DIAGNOSIS — Z0489 Encounter for examination and observation for other specified reasons: Secondary | ICD-10-CM

## 2017-04-10 ENCOUNTER — Encounter (HOSPITAL_COMMUNITY): Payer: Self-pay

## 2017-05-12 ENCOUNTER — Ambulatory Visit (HOSPITAL_COMMUNITY)
Admission: RE | Admit: 2017-05-12 | Discharge: 2017-05-12 | Disposition: A | Discharge status: Home / Self Care | Payer: PRIVATE HEALTH INSURANCE | Source: Ambulatory Visit | Attending: Obstetrics and Gynecology | Admitting: Obstetrics and Gynecology

## 2017-05-12 ENCOUNTER — Encounter (HOSPITAL_COMMUNITY): Payer: Self-pay

## 2017-05-12 ENCOUNTER — Other Ambulatory Visit (HOSPITAL_COMMUNITY): Payer: Self-pay | Admitting: Obstetrics and Gynecology

## 2017-05-12 DIAGNOSIS — Z79899 Other long term (current) drug therapy: Secondary | ICD-10-CM | POA: Diagnosis not present

## 2017-05-12 DIAGNOSIS — O99283 Endocrine, nutritional and metabolic diseases complicating pregnancy, third trimester: Secondary | ICD-10-CM | POA: Insufficient documentation

## 2017-05-12 DIAGNOSIS — Z362 Encounter for other antenatal screening follow-up: Secondary | ICD-10-CM | POA: Diagnosis not present

## 2017-05-12 DIAGNOSIS — Z6838 Body mass index (BMI) 38.0-38.9, adult: Secondary | ICD-10-CM | POA: Insufficient documentation

## 2017-05-12 DIAGNOSIS — IMO0002 Reserved for concepts with insufficient information to code with codable children: Secondary | ICD-10-CM

## 2017-05-12 DIAGNOSIS — Z3A30 30 weeks gestation of pregnancy: Secondary | ICD-10-CM | POA: Diagnosis not present

## 2017-05-12 DIAGNOSIS — O99213 Obesity complicating pregnancy, third trimester: Secondary | ICD-10-CM | POA: Diagnosis not present

## 2017-05-12 DIAGNOSIS — O09523 Supervision of elderly multigravida, third trimester: Secondary | ICD-10-CM | POA: Diagnosis not present

## 2017-05-12 DIAGNOSIS — Z0489 Encounter for examination and observation for other specified reasons: Secondary | ICD-10-CM

## 2017-05-12 DIAGNOSIS — E039 Hypothyroidism, unspecified: Secondary | ICD-10-CM | POA: Diagnosis not present

## 2017-05-12 DIAGNOSIS — E669 Obesity, unspecified: Secondary | ICD-10-CM | POA: Insufficient documentation

## 2017-06-26 LAB — OB RESULTS CONSOLE GBS: GBS: NEGATIVE

## 2017-07-08 ENCOUNTER — Inpatient Hospital Stay (HOSPITAL_COMMUNITY)
Admission: AD | Admit: 2017-07-08 | Discharge: 2017-07-11 | DRG: 775 | Disposition: A | Discharge status: Home / Self Care | Payer: PRIVATE HEALTH INSURANCE | Source: Ambulatory Visit | Attending: Obstetrics and Gynecology | Admitting: Obstetrics and Gynecology

## 2017-07-08 ENCOUNTER — Encounter (HOSPITAL_COMMUNITY): Payer: Self-pay

## 2017-07-08 DIAGNOSIS — O4292 Full-term premature rupture of membranes, unspecified as to length of time between rupture and onset of labor: Secondary | ICD-10-CM | POA: Diagnosis present

## 2017-07-08 DIAGNOSIS — O99284 Endocrine, nutritional and metabolic diseases complicating childbirth: Secondary | ICD-10-CM | POA: Diagnosis present

## 2017-07-08 DIAGNOSIS — Z6839 Body mass index (BMI) 39.0-39.9, adult: Secondary | ICD-10-CM | POA: Diagnosis not present

## 2017-07-08 DIAGNOSIS — O99214 Obesity complicating childbirth: Secondary | ICD-10-CM | POA: Diagnosis present

## 2017-07-08 DIAGNOSIS — O4290 Premature rupture of membranes, unspecified as to length of time between rupture and onset of labor, unspecified weeks of gestation: Secondary | ICD-10-CM | POA: Diagnosis present

## 2017-07-08 DIAGNOSIS — O322XX0 Maternal care for transverse and oblique lie, not applicable or unspecified: Secondary | ICD-10-CM | POA: Diagnosis present

## 2017-07-08 DIAGNOSIS — J45909 Unspecified asthma, uncomplicated: Secondary | ICD-10-CM | POA: Diagnosis present

## 2017-07-08 DIAGNOSIS — Z3A38 38 weeks gestation of pregnancy: Secondary | ICD-10-CM

## 2017-07-08 DIAGNOSIS — E039 Hypothyroidism, unspecified: Secondary | ICD-10-CM | POA: Diagnosis present

## 2017-07-08 DIAGNOSIS — O9952 Diseases of the respiratory system complicating childbirth: Secondary | ICD-10-CM | POA: Diagnosis present

## 2017-07-08 DIAGNOSIS — O9912 Other diseases of the blood and blood-forming organs and certain disorders involving the immune mechanism complicating childbirth: Secondary | ICD-10-CM | POA: Diagnosis present

## 2017-07-08 DIAGNOSIS — D696 Thrombocytopenia, unspecified: Secondary | ICD-10-CM | POA: Diagnosis present

## 2017-07-08 LAB — POCT FERN TEST: POCT Fern Test: POSITIVE

## 2017-07-08 MED ORDER — OXYCODONE-ACETAMINOPHEN 5-325 MG PO TABS: 1.0000 | ORAL_TABLET | ORAL | Status: DC | PRN

## 2017-07-08 MED ORDER — BUTORPHANOL TARTRATE 1 MG/ML IJ SOLN: 1.0000 mg | INTRAMUSCULAR | Status: DC | PRN

## 2017-07-08 MED ORDER — LACTATED RINGERS IV SOLN
500.0000 mL | INTRAVENOUS | Status: DC | PRN
  Administered 2017-07-09: 500 mL via INTRAVENOUS

## 2017-07-08 MED ORDER — LIDOCAINE HCL (PF) 1 % IJ SOLN
30.0000 mL | INTRAMUSCULAR | Status: DC | PRN
  Filled 2017-07-08: qty 30

## 2017-07-08 MED ORDER — OXYTOCIN BOLUS FROM INFUSION
500.0000 mL | Freq: Once | INTRAVENOUS | Status: AC
  Administered 2017-07-09: 500 mL via INTRAVENOUS

## 2017-07-08 MED ORDER — LACTATED RINGERS IV SOLN
INTRAVENOUS | Status: DC
  Administered 2017-07-08: via INTRAVENOUS

## 2017-07-08 MED ORDER — ACETAMINOPHEN 325 MG PO TABS: 650.0000 mg | ORAL_TABLET | ORAL | Status: DC | PRN

## 2017-07-08 MED ORDER — OXYCODONE-ACETAMINOPHEN 5-325 MG PO TABS: 2.0000 | ORAL_TABLET | ORAL | Status: DC | PRN

## 2017-07-08 MED ORDER — SOD CITRATE-CITRIC ACID 500-334 MG/5ML PO SOLN: 30.0000 mL | ORAL | Status: DC | PRN

## 2017-07-08 MED ORDER — OXYTOCIN 40 UNITS IN LACTATED RINGERS INFUSION - SIMPLE MED
1.0000 m[IU]/min | INTRAVENOUS | Status: DC
  Administered 2017-07-09: 2 m[IU]/min via INTRAVENOUS

## 2017-07-08 MED ORDER — OXYTOCIN 40 UNITS IN LACTATED RINGERS INFUSION - SIMPLE MED
2.5000 [IU]/h | INTRAVENOUS | Status: DC
  Administered 2017-07-09: 2.5 [IU]/h via INTRAVENOUS
  Filled 2017-07-08: qty 1000

## 2017-07-08 MED ORDER — ONDANSETRON HCL 4 MG/2ML IJ SOLN: 4.0000 mg | Freq: Four times a day (QID) | INTRAMUSCULAR | Status: DC | PRN

## 2017-07-08 NOTE — MAU Note (Signed)
Pt here with c/o rupture of membranes at 2040 today, clear fluid. Was 2 cm today in the office. Not hurting much now, but had hx of fast labor with first baby.

## 2017-07-09 ENCOUNTER — Inpatient Hospital Stay (HOSPITAL_COMMUNITY): Payer: PRIVATE HEALTH INSURANCE | Admitting: Anesthesiology

## 2017-07-09 ENCOUNTER — Encounter (HOSPITAL_COMMUNITY): Payer: Self-pay | Admitting: *Deleted

## 2017-07-09 LAB — CBC
HCT: 33.6 % — ABNORMAL LOW (ref 36.0–46.0)
HEMATOCRIT: 33.6 % — AB (ref 36.0–46.0)
HEMOGLOBIN: 11.3 g/dL — AB (ref 12.0–15.0)
Hemoglobin: 11.5 g/dL — ABNORMAL LOW (ref 12.0–15.0)
MCH: 31.2 pg (ref 26.0–34.0)
MCH: 31.7 pg (ref 26.0–34.0)
MCHC: 33.6 g/dL (ref 30.0–36.0)
MCHC: 34.2 g/dL (ref 30.0–36.0)
MCV: 92.8 fL (ref 78.0–100.0)
MCV: 92.6 fL (ref 78.0–100.0)
PLATELETS: 143 10*3/uL — AB (ref 150–400)
Platelets: 133 10*3/uL — ABNORMAL LOW (ref 150–400)
RBC: 3.62 MIL/uL — AB (ref 3.87–5.11)
RBC: 3.63 MIL/uL — ABNORMAL LOW (ref 3.87–5.11)
RDW: 13.5 % (ref 11.5–15.5)
RDW: 13.3 % (ref 11.5–15.5)
WBC: 9.9 10*3/uL (ref 4.0–10.5)
WBC: 15.5 10*3/uL — ABNORMAL HIGH (ref 4.0–10.5)

## 2017-07-09 LAB — TYPE AND SCREEN
ABO/RH(D): O POS
Antibody Screen: NEGATIVE

## 2017-07-09 LAB — ABO/RH: ABO/RH(D): O POS

## 2017-07-09 LAB — RPR: RPR: NONREACTIVE

## 2017-07-09 MED ORDER — PRENATAL MULTIVITAMIN CH
1.0000 | ORAL_TABLET | Freq: Every day | ORAL | Status: DC
  Administered 2017-07-10: 1 via ORAL
  Filled 2017-07-09 (×3): qty 1

## 2017-07-09 MED ORDER — SIMETHICONE 80 MG PO CHEW: 80.0000 mg | CHEWABLE_TABLET | ORAL | Status: DC | PRN

## 2017-07-09 MED ORDER — WITCH HAZEL-GLYCERIN EX PADS: 1.0000 "application " | MEDICATED_PAD | CUTANEOUS | Status: DC | PRN

## 2017-07-09 MED ORDER — DIPHENHYDRAMINE HCL 50 MG/ML IJ SOLN: 12.5000 mg | INTRAMUSCULAR | Status: DC | PRN

## 2017-07-09 MED ORDER — FENTANYL 2.5 MCG/ML BUPIVACAINE 1/10 % EPIDURAL INFUSION (WH - ANES)
14.0000 mL/h | INTRAMUSCULAR | Status: DC | PRN
  Administered 2017-07-09: 14 mL/h via EPIDURAL
  Filled 2017-07-09: qty 100

## 2017-07-09 MED ORDER — PHENYLEPHRINE 40 MCG/ML (10ML) SYRINGE FOR IV PUSH (FOR BLOOD PRESSURE SUPPORT)
80.0000 ug | PREFILLED_SYRINGE | INTRAVENOUS | Status: DC | PRN
  Filled 2017-07-09: qty 10
  Filled 2017-07-09: qty 5

## 2017-07-09 MED ORDER — LACTATED RINGERS IV SOLN: 500.0000 mL | Freq: Once | INTRAVENOUS | Status: DC

## 2017-07-09 MED ORDER — OXYCODONE HCL 5 MG PO TABS: 10.0000 mg | ORAL_TABLET | ORAL | Status: DC | PRN

## 2017-07-09 MED ORDER — EPHEDRINE 5 MG/ML INJ
10.0000 mg | INTRAVENOUS | Status: DC | PRN
  Filled 2017-07-09: qty 2

## 2017-07-09 MED ORDER — ONDANSETRON HCL 4 MG/2ML IJ SOLN: 4.0000 mg | INTRAMUSCULAR | Status: DC | PRN

## 2017-07-09 MED ORDER — DIBUCAINE 1 % RE OINT: 1.0000 "application " | TOPICAL_OINTMENT | RECTAL | Status: DC | PRN

## 2017-07-09 MED ORDER — LACTATED RINGERS IV SOLN
500.0000 mL | Freq: Once | INTRAVENOUS | Status: AC
  Administered 2017-07-09: 500 mL via INTRAVENOUS

## 2017-07-09 MED ORDER — PHENYLEPHRINE 40 MCG/ML (10ML) SYRINGE FOR IV PUSH (FOR BLOOD PRESSURE SUPPORT): 80.0000 ug | PREFILLED_SYRINGE | INTRAVENOUS | Status: DC | PRN

## 2017-07-09 MED ORDER — IBUPROFEN 600 MG PO TABS
600.0000 mg | ORAL_TABLET | Freq: Four times a day (QID) | ORAL | Status: DC
  Administered 2017-07-09 – 2017-07-11 (×9): 600 mg via ORAL
  Filled 2017-07-09 (×9): qty 1

## 2017-07-09 MED ORDER — BENZOCAINE-MENTHOL 20-0.5 % EX AERO
1.0000 "application " | INHALATION_SPRAY | CUTANEOUS | Status: DC | PRN
  Administered 2017-07-09: 1 via TOPICAL
  Filled 2017-07-09: qty 56

## 2017-07-09 MED ORDER — EPHEDRINE 5 MG/ML INJ: 10.0000 mg | INTRAVENOUS | Status: DC | PRN

## 2017-07-09 MED ORDER — ACETAMINOPHEN 325 MG PO TABS: 650.0000 mg | ORAL_TABLET | ORAL | Status: DC | PRN

## 2017-07-09 MED ORDER — DIPHENHYDRAMINE HCL 25 MG PO CAPS: 25.0000 mg | ORAL_CAPSULE | Freq: Four times a day (QID) | ORAL | Status: DC | PRN

## 2017-07-09 MED ORDER — COCONUT OIL OIL: 1.0000 "application " | TOPICAL_OIL | Status: DC | PRN

## 2017-07-09 MED ORDER — ONDANSETRON HCL 4 MG PO TABS: 4.0000 mg | ORAL_TABLET | ORAL | Status: DC | PRN

## 2017-07-09 MED ORDER — SENNOSIDES-DOCUSATE SODIUM 8.6-50 MG PO TABS
2.0000 | ORAL_TABLET | ORAL | Status: DC
  Filled 2017-07-09 (×2): qty 2

## 2017-07-09 MED ORDER — ZOLPIDEM TARTRATE 5 MG PO TABS: 5.0000 mg | ORAL_TABLET | Freq: Every evening | ORAL | Status: DC | PRN

## 2017-07-09 MED ORDER — PHENYLEPHRINE 40 MCG/ML (10ML) SYRINGE FOR IV PUSH (FOR BLOOD PRESSURE SUPPORT)
80.0000 ug | PREFILLED_SYRINGE | INTRAVENOUS | Status: DC | PRN
  Filled 2017-07-09: qty 5

## 2017-07-09 MED ORDER — LEVOTHYROXINE SODIUM 50 MCG PO TABS
50.0000 ug | ORAL_TABLET | Freq: Every day | ORAL | Status: DC
  Administered 2017-07-09 – 2017-07-11 (×3): 50 ug via ORAL
  Filled 2017-07-09 (×4): qty 1

## 2017-07-09 MED ORDER — TETANUS-DIPHTH-ACELL PERTUSSIS 5-2.5-18.5 LF-MCG/0.5 IM SUSP: 0.5000 mL | Freq: Once | INTRAMUSCULAR | Status: DC

## 2017-07-09 MED ORDER — LIDOCAINE HCL (PF) 1 % IJ SOLN
INTRAMUSCULAR | Status: DC | PRN
  Administered 2017-07-09 (×2): 6 mL via EPIDURAL

## 2017-07-09 MED ORDER — OXYCODONE HCL 5 MG PO TABS: 5.0000 mg | ORAL_TABLET | ORAL | Status: DC | PRN

## 2017-07-09 NOTE — H&P (Signed)
Lauren BoschCourtney G Huang is a 36 y.o. female presenting for spontaneous rupture of membranes at term. Pt reports lasrge gush of clear fluid at 830pm. She is not appreciating painful contractions. She was 2cm dilated earlier in office today but now 3cm. Her prenatal care is dated by an 8weeks US.  She has hypothyroidism - controlled on 50mcg synthroid. Hx of asthma - maintained on singulair and albuterol prn. Noted to have mild thrombocytopenia (120) in late June but up to 133 today. She took a course of tamiflu early in pregnancy after possible exposure. She is GBS negative. Neg essential panel and panorama was low risk  OB History    Gravida Para Term Preterm AB Living   3 1 1   1 1    SAB TAB Ectopic Multiple Live Births   1             Past Medical History:  Diagnosis Date  . Allergic rhinitis   . Asthma   . Bronchitis   . Hypothyroidism    Past Surgical History:  Procedure Laterality Date  . TONSILLECTOMY    . WISDOM TOOTH EXTRACTION     Family History: family history is not on file. Social History:  reports that she has never smoked. She has never used smokeless tobacco. She reports that she does not drink alcohol or use drugs.     Maternal Diabetes: No Genetic Screening: Normal Maternal Ultrasounds/Referrals: Normal Fetal Ultrasounds or other Referrals:  None Maternal Substance Abuse:  No Significant Maternal Medications:  Meds include: Syntroid Other: albuterol, singulair prn Significant Maternal Lab Results:  Lab values include: Group B Strep negative Other Comments:  None  Review of Systems  Constitutional: Negative for chills, fever and malaise/fatigue.  Eyes: Negative for blurred vision.  Respiratory: Negative for shortness of breath.   Cardiovascular: Negative for chest pain.  Gastrointestinal: Negative for abdominal pain, heartburn, nausea and vomiting.  Genitourinary: Negative for dysuria.  Musculoskeletal: Negative for back pain.  Skin: Negative for itching and  rash.  Neurological: Negative for dizziness and headaches.  Endo/Heme/Allergies: Does not bruise/bleed easily.  Psychiatric/Behavioral: Negative for depression, hallucinations, substance abuse and suicidal ideas. The patient is not nervous/anxious.    Maternal Medical History:  Reason for admission: Rupture of membranes.  Nausea.  Contractions: Perceived severity is mild.    Fetal activity: Perceived fetal activity is normal.   Last perceived fetal movement was within the past hour.    Prenatal complications: Thrombocytopenia.   Prenatal Complications - Diabetes: none.    Dilation: 3 Effacement (%): 80 Station: -2 Exam by:: Dr. Mindi SlickerBanga  Blood pressure 104/78, pulse 83, temperature 97.6 F (36.4 C), temperature source Oral, resp. rate 18, height 5\' 6"  (1.676 m), weight 244 lb (110.7 kg), last menstrual period 09/25/2016. Maternal Exam:  Uterine Assessment: Contraction strength is mild.  Contraction frequency is regular.   Abdomen: Patient reports no abdominal tenderness. Estimated fetal weight is AGA.   Fetal presentation: vertex  Introitus: Normal vulva. Vulva is negative for condylomata and lesion.  Normal vagina.  Vagina is negative for condylomata.  Ferning test: positive.  Nitrazine test: positive. Amniotic fluid character: clear.  Pelvis: adequate for delivery.   Cervix: Cervix evaluated by digital exam.     Physical Exam  Constitutional: She is oriented to person, place, and time. She appears well-developed and well-nourished.  HENT:  Head: Normocephalic.  Eyes: Pupils are equal, round, and reactive to light.  Neck: Normal range of motion.  Cardiovascular: Normal rate.   Respiratory:  Effort normal.  GI: Soft.  Genitourinary: Vagina normal and uterus normal. Vulva exhibits no lesion.  Musculoskeletal: Normal range of motion.  Neurological: She is alert and oriented to person, place, and time.  Skin: Skin is warm.  Psychiatric: She has a normal mood and affect.  Her behavior is normal. Judgment and thought content normal.    Prenatal labs: ABO, Rh: O/Positive/-- (01/18 0000) Antibody: Negative (01/18 0000) Rubella: Immune (01/18 0000) RPR: Nonreactive (01/18 0000)  HBsAg: Negative (01/18 0000)  HIV: Non-reactive (01/18 0000)  GBS: Negative (07/27 0000)   Assessment/Plan: [id number] at 38 4/[redacted]wks gestation with SROM at term Pitocin for augmentation Pain control per pt request - plts stable so may also have epidural as option Anticipate svd  [name] [name] [name] 07/09/2017, 12:28 AM

## 2017-07-09 NOTE — Anesthesia Preprocedure Evaluation (Addendum)
Anesthesia Evaluation  Patient identified by MRN, date of birth, ID band Patient awake    Reviewed: Allergy & Precautions, NPO status , Patient's Chart, lab work & pertinent test results  History of Anesthesia Complications Negative for: history of anesthetic complications  Airway Mallampati: III  TM Distance: >3 FB Neck ROM: Full    Dental  (+) Dental Advisory Given   Pulmonary asthma (rarely needs rescue inhaler) ,    breath sounds clear to auscultation       Cardiovascular negative cardio ROS   Rhythm:Regular Rate:Normal     Neuro/Psych negative neurological ROS     GI/Hepatic Neg liver ROS, GERD  ,  Endo/Other  Hypothyroidism Morbid obesity  Renal/GU negative Renal ROS     Musculoskeletal   Abdominal (+) + obese,   Peds  Hematology plt 133k   Anesthesia Other Findings   Reproductive/Obstetrics (+) Pregnancy                            Anesthesia Physical Anesthesia Plan  ASA: II  Anesthesia Plan: Epidural   Post-op Pain Management:    Induction:   PONV Risk Score and Plan: Treatment may vary due to age or medical condition  Airway Management Planned: Natural Airway  Additional Equipment:   Intra-op Plan:   Post-operative Plan:   Informed Consent: I have reviewed the patients History and Physical, chart, labs and discussed the procedure including the risks, benefits and alternatives for the proposed anesthesia with the patient or authorized representative who has indicated his/her understanding and acceptance.   Dental advisory given  Plan Discussed with:   Anesthesia Plan Comments: (Patient identified. Risks/Benefits/Options discussed with patient including but not limited to bleeding, infection, nerve damage, paralysis, failed block, incomplete pain control, headache, blood pressure changes, nausea, vomiting, reactions to medication both or allergic, itching and  postpartum back pain. Confirmed with bedside nurse the patient's most recent platelet count. Confirmed with patient that they are not currently taking any anticoagulation, have any bleeding history or any family history of bleeding disorders. Patient expressed understanding and wished to proceed. All questions were answered. )       Anesthesia Quick Evaluation

## 2017-07-09 NOTE — Anesthesia Procedure Notes (Signed)
Epidural Patient location during procedure: OB Start time: 07/09/2017 2:32 AM End time: 07/09/2017 2:54 AM  Staffing Anesthesiologist: Jairo BenJACKSON, Zamoria Boss Performed: anesthesiologist   Preanesthetic Checklist Completed: patient identified, surgical consent, pre-op evaluation, timeout performed, IV checked, risks and benefits discussed and monitors and equipment checked  Epidural Patient position: sitting Prep: site prepped and draped and DuraPrep Patient monitoring: blood pressure, continuous pulse ox and heart rate Approach: midline Location: L3-L4 Injection technique: LOR air  Needle:  Needle type: Tuohy  Needle gauge: 17 G Needle length: 9 cm Needle insertion depth: 6 cm Catheter type: closed end flexible Catheter size: 19 Gauge Catheter at skin depth: 12 cm Test dose: negative (1% lidocaine)  Assessment Events: blood not aspirated, injection not painful, no injection resistance, negative IV test and no paresthesia  Additional Notes Pt identified in Labor room.  Monitors applied. Working IV access confirmed. Sterile prep, drape lumbar spine.  1% lido local L 3,4.  #17ga Touhy LOR air at 6 cm L 3,4, cath in easily to 12 cm skin. Test dose OK, cath dosed and infusion begun.  Patient asymptomatic, VSS, no heme aspirated, tolerated well.  Sandford Craze Carel Carrier, MDReason for block:procedure for pain

## 2017-07-09 NOTE — Anesthesia Postprocedure Evaluation (Signed)
Anesthesia Post Note  Patient: Jackquline BoschCourtney G Streng  Procedure(s) Performed: * No procedures listed *     Patient location during evaluation: Mother Baby Anesthesia Type: Epidural Level of consciousness: awake and alert and oriented Pain management: pain level controlled Vital Signs Assessment: post-procedure vital signs reviewed and stable Respiratory status: spontaneous breathing and nonlabored ventilation Cardiovascular status: stable Postop Assessment: no headache, no signs of nausea or vomiting, no backache, adequate PO intake, epidural receding and patient able to bend at knees Anesthetic complications: no    Last Vitals:  Vitals:   07/09/17 0840 07/09/17 1340  BP: 127/75 106/64  Pulse: 70 69  Resp: 18 16  Temp: 36.7 C 36.9 C  SpO2: 99% 99%    Last Pain:  Vitals:   07/09/17 1340  TempSrc: Oral  PainSc: 0-No pain   Pain Goal:                 Land O'LakesMalinova,Meldon Hanzlik Hristova

## 2017-07-09 NOTE — Progress Notes (Signed)
Patient ID: [name], female   DOB: 06/15/1981, 36 y.o.   MRN: [id number] Pt complete with urge to push after laboring down Pushing at this time Anticipate svd

## 2017-07-09 NOTE — Lactation Note (Signed)
This note was copied from a baby's chart. Lactation Consultation Note  Patient Name: Boy Yvetta CoderCourtney Montroy XBJYN'WToday's Date: 07/09/2017 Reason for consult: Initial assessment Breastfeeding consultation services and support information given. This is mom's second baby and newborn is 746 hours old.  Mom skin to skin with baby and he is showing feeding cues.  She has attempted to latch without success.  Baby has a slightly recessed chin.  Mom has short nipples and semi compressible, thick areola.  Assisted with football and cross cradle hold.  Baby very fussy and unable to grasp breast. Baby pulls tongue to back of his mouth on gloved finger.  Instructed mom to wear breast shells, pre pump with manual pump, suck training on finger and lots of skin to skin.  LC phone number left to call for assist prn.  Maternal Data Has patient been taught Hand Expression?: Yes Does the patient have breastfeeding experience prior to this delivery?: Yes  Feeding Feeding Type: Breast Fed Length of feed:  (few sucks.)  LATCH Score Latch: Too sleepy or reluctant, no latch achieved, no sucking elicited.  Audible Swallowing: None  Type of Nipple: Everted at rest and after stimulation  Comfort (Breast/Nipple): Soft / non-tender  Hold (Positioning): Assistance needed to correctly position infant at breast and maintain latch.  LATCH Score: 5  Interventions Interventions: Breast feeding basics reviewed;Breast compression;Assisted with latch;Adjust position;Skin to skin;Support pillows;Position options;Hand express;Pre-pump if needed;Shells;Hand pump  Lactation Tools Discussed/Used Tools: Shells;Pump Shell Type: Inverted Breast pump type: Manual   Consult Status Consult Status: Follow-up Date: 07/10/17 Follow-up type: In-patient    Huston FoleyMOULDEN, Kaetlyn Noa S 07/09/2017, 12:05 PM

## 2017-07-10 LAB — CBC
HCT: 29.9 % — ABNORMAL LOW (ref 36.0–46.0)
Hemoglobin: 10.4 g/dL — ABNORMAL LOW (ref 12.0–15.0)
MCH: 32.4 pg (ref 26.0–34.0)
MCHC: 34.8 g/dL (ref 30.0–36.0)
MCV: 93.1 fL (ref 78.0–100.0)
PLATELETS: 120 10*3/uL — AB (ref 150–400)
RBC: 3.21 MIL/uL — ABNORMAL LOW (ref 3.87–5.11)
RDW: 13.6 % (ref 11.5–15.5)
WBC: 8.6 10*3/uL (ref 4.0–10.5)

## 2017-07-10 NOTE — Lactation Note (Signed)
This note was copied from a baby's chart. Lactation Consultation Note: Observed mother with infant latched to the Rt breast in football hold. Infant suckling with short burst of sucks and bouncing on and off. Somewhat fussy.  Mother has semi-flat nipples.  Mother reports that infant has a high arched palate. Assessed with a gloved finger and infant does have high palate with prominent upper gum ridge. Mother was fit with a #24 NS. Infant sustained latch with better depth. Observed more of a rhythmic suckling pattern. Infant was given 3-4 ml of Alimentum with a curved tip syringe while at the breast. Mother reports that infant is feeding much better this feeding.  Suggested that mother continue to supplement infant with ebm/formula with each feeding.  Advised to continue to post pump for 15-20 mins after each feeding. Mother to continue to cue base feed and feed infant at least 8-12 feedings in 24 hours. Mother receptive to all teaching.   Patient Name: [name] UJWJX'BToday's Date: 07/10/2017 Reason for consult: Follow-up assessment   Maternal Data    Feeding Feeding Type: Breast Fed Length of feed:  (on the Lt breast with better depth and #24 NS)  LATCH Score Latch: Grasps breast easily, tongue down, lips flanged, rhythmical sucking.  Audible Swallowing: A few with stimulation (observed small drop of milk in shield)  Type of Nipple: Flat  Comfort (Breast/Nipple): Soft / non-tender  Hold (Positioning): No assistance needed to correctly position infant at breast.  LATCH Score: 8  Interventions    Lactation Tools Discussed/Used Tools: Nipple Shields Nipple shield size: 24   Consult Status Consult Status: Follow-up Date: 07/11/17 Follow-up type: In-patient    [name], [name] [facility] 07/10/2017, 2:38 PM

## 2017-07-10 NOTE — Progress Notes (Signed)
Patient ID: [name], female   DOB: 02/10/1981, 36 y.o.   MRN: [id number] Pt doing well No complaints Routine pp care

## 2017-07-10 NOTE — Progress Notes (Signed)
Patient ID: [name], female   DOB: 02/13/1981, 36 y.o.   MRN: [id number] Pt doing well. Pain well controlled. Ambulating with no lightheadedness. Lochia mild. No fever, chills, sob or cp. Bonding well with baby VSS ABD - soft, FF EXT - no homans  8.6>10.4<120  A/P: PPD#1 s/p svd - stable         Routine pp care         Likely discharge to home tomorrow

## 2017-07-10 NOTE — Lactation Note (Signed)
This note was copied from a baby's chart. Lactation Consultation Note Baby fussy yet will not BF. Has uncoordinated suck. When cries tongue curls back mid tongue unable to touch palate. Possible posterior tight frenulum. Upper labial frenulum thick, recessed chin. Mom is concerned baby hasn't BF any, sleepy, bili level rising, bruising to face.  Hand expressed a drop of colostrum. Discussed w/mom possible supplementing w/Alimentum d/t elevate bili, syring feed baby. Mom in agreement.   Pendulum breast w/nipple at bottom end, has edema to areola and nipple. Reverse pressure w/little help. Encouraged mom to wear shells to assist in everting nipples. [facility]. Taught "C" hold when latching w/NS. Baby latched on, suckled for a few minutes then help in mouth while slept. Noted drop of colostrum in NS.  Syring fed baby Alimentum w/o difficulty. Baby did have small emesis when burped. Mom has DEBP set up. Will pump after sleeping some d/t no sleep in over 24 hrs.   Plans for now is to BF w/NS, Hand express any colostrum Syring feed Alimentum Post pump Patient Name: [name] ZOXWR'UToday's Date: 07/10/2017 Reason for consult: Follow-up assessment   Maternal Data    Feeding Feeding Type: Formula Length of feed: 2 min  LATCH Score Latch: Too sleepy or reluctant, no latch achieved, no sucking elicited.  Audible Swallowing: None  Type of Nipple: Flat  Comfort (Breast/Nipple): Soft / non-tender  Hold (Positioning): Assistance needed to correctly position infant at breast and maintain latch.  LATCH Score: 4  Interventions Interventions: Support pillows;Assisted with latch;Position options;Breast feeding basics reviewed;Expressed milk;Breast massage;Hand express;Shells;Reverse pressure;Breast compression;Adjust position;DEBP  Lactation Tools Discussed/Used Tools: Nipple Dorris CarnesShields;Shells;Pump Nipple shield size: 20 Shell Type: Inverted Breast pump type:  Double-Electric Breast Pump   Consult Status Consult Status: Follow-up Date: 07/10/17 Follow-up type: In-patient    [name], Diamond NickelLAURA G 07/10/2017, 2:09 AM

## 2017-07-11 MED ORDER — IBUPROFEN 600 MG PO TABS: 600.0000 mg | ORAL_TABLET | Freq: Four times a day (QID) | ORAL | 1 refills | Status: AC | PRN

## 2017-07-11 NOTE — Discharge Summary (Signed)
    OB Discharge Summary     Patient Name: [name] DOB: 08/06/1981 MRN: [id number]  Date of admission: 07/08/2017 Delivering MD: Pryor OchoaBANGA, Jermia Rigsby [facility]   Date of discharge: 07/11/2017  Admitting diagnosis: 38.3wks water broke no consistent CTX  Intrauterine pregnancy: 6862w4d     Secondary diagnosis:  Active Problems:   Delayed delivery after SROM (spontaneous rupture of membranes)   SVD (spontaneous vaginal delivery)   Postpartum care following vaginal delivery  Additional problems: none     Discharge diagnosis: Term Pregnancy Delivered                                                                                                Post partum procedures:none  Augmentation: Pitocin  Complications: None  Hospital course:  Onset of Labor With Vaginal Delivery     36 y.o. yo [id number] at 6862w4d was admitted in Latent Labor on 07/08/2017. Patient had an uncomplicated labor course as follows:  Membrane Rupture Time/Date: 8:40 PM ,07/08/2017   Intrapartum Procedures: Episiotomy: None [1]                                         Lacerations:  1st degree [2];Perineal [11]  Patient had a delivery of a Viable infant. 07/09/2017  Information for the patient's newborn:  [name], Boy Neena [[id number]][[id number]]  Delivery Method: Vag-Spont    Pateint had an uncomplicated postpartum course.  She is ambulating, tolerating a regular diet, passing flatus, and urinating well. Patient is discharged home in stable condition on 07/11/17.   Physical exam  Vitals:   07/09/17 1920 07/10/17 0635 07/10/17 1812 07/11/17 0531  BP: 110/72 107/76 119/71 103/68  Pulse: 72 66 81 68  Resp: 18 17 18 18   Temp: 97.9 F (36.6 C) 97.6 F (36.4 C) 98.2 F (36.8 C) 98.2 F (36.8 C)  TempSrc: Oral Oral Oral Oral  SpO2:      Weight:      Height:       General: alert, cooperative and no distress Lochia: appropriate Uterine Fundus: firm Incision: N/A DVT Evaluation: No evidence of DVT seen on physical  exam. Negative Homan's sign. Labs: Lab Results  Component Value Date   WBC 8.6 07/10/2017   HGB 10.4 (L) 07/10/2017   HCT 29.9 (L) 07/10/2017   MCV 93.1 07/10/2017   PLT 120 (L) 07/10/2017   No flowsheet data found.  Discharge instruction: per After Visit Summary and "Baby and Me Booklet".  After visit meds:    Diet: routine diet  Activity: Advance as tolerated. Pelvic rest for 6 weeks.   Outpatient follow up:BP check next week; 6 week postpartum visit  Follow up Appt:No future appointments. Follow up Visit:No Follow-up on file.  Postpartum contraception: Not Discussed  Newborn Data: Live born female  Birth Weight: 8 lb (3630 g) APGAR: 8, 9  Baby Feeding: Breast Disposition:home with mother   07/11/2017 Lauren Musterecilia W Trenton Passow, DO

## 2017-07-11 NOTE — Discharge Instructions (Signed)
Nothing in vagina for 6 weeks.  No sex, tampons, and douching.  Other instructions as in [facility] Discharge Booklet. °

## 2017-07-11 NOTE — Progress Notes (Signed)
Patient ID: [name], female   DOB: 11/15/1981, 36 y.o.   MRN: [id number] Pt doing well. Pain controlled. Lochia mild. Ambulating and tolerating diet well. Denies any fever, chills, HAs or CP. Bonding well with baby. Ready for discharge to home today VS - 103/68 ABD - FF EXT - mild edema only   A/P: PPD # 1 s/p svd - stable         Discharge instructions reviewed.          F/u in 3 and 6 weeks in office

## 2017-07-11 NOTE — Lactation Note (Signed)
This note was copied from a baby's chart. Lactation Consultation Note  Patient Name: Boy Yvetta CoderCourtney Tolin AVWUJ'WToday's Date: 07/11/2017   P2, Baby 51 hours old.  High Palate. Mother wearing shells which have helped evert nipples. Mother is breastfeeding with #24NS, pumping and supplementing with ebm and formula after feedings. Getting drops w/ pumping. Attempted latching without NS.  Baby did not sustain latch. Finger syringe fed baby 4 ml to calm baby for feeding. Applied #24NS and baby sucked a few times and came off fussy.  Introduced 5 Fr feeding tube under NS to supplement at breast. Baby maintained latch and took approx 10 ml for a total of 14 ml and became sleepy. Plan is for mother to use 5 Fr at the breast for supplementation and syringe or slow flow nipple after breastfeeding. Recommend breastfeeding on demand.  Try once per day to bf without NS. Post pump 4-6 times per day for 10-20 min.   Give volume pumped back to baby and the difference w/ formula. Discussed hands on pumping and suggest OP appt. Mom encouraged to feed baby 8-12 times/24 hours and with feeding cues.  Reviewed engorgement care and monitoring voids/stools.        Maternal Data    Feeding Feeding Type: Breast Fed Length of feed: 28 min  LATCH Score                   Interventions    Lactation Tools Discussed/Used     Consult Status      Hardie PulleyBerkelhammer, Amrie Gurganus Boschen 07/11/2017, 9:04 AM

## 2017-12-26 ENCOUNTER — Emergency Department (HOSPITAL_COMMUNITY): Payer: BC Managed Care – PPO

## 2017-12-26 ENCOUNTER — Emergency Department (HOSPITAL_COMMUNITY)
Admission: EM | Admit: 2017-12-26 | Discharge: 2017-12-26 | Disposition: A | Discharge status: Home / Self Care | Payer: BC Managed Care – PPO | Attending: Emergency Medicine | Admitting: Emergency Medicine

## 2017-12-26 ENCOUNTER — Other Ambulatory Visit: Payer: Self-pay

## 2017-12-26 ENCOUNTER — Encounter (HOSPITAL_COMMUNITY): Payer: Self-pay | Admitting: Emergency Medicine

## 2017-12-26 DIAGNOSIS — J189 Pneumonia, unspecified organism: Secondary | ICD-10-CM | POA: Insufficient documentation

## 2017-12-26 DIAGNOSIS — R05 Cough: Secondary | ICD-10-CM | POA: Diagnosis present

## 2017-12-26 DIAGNOSIS — E039 Hypothyroidism, unspecified: Secondary | ICD-10-CM | POA: Diagnosis not present

## 2017-12-26 DIAGNOSIS — Z79899 Other long term (current) drug therapy: Secondary | ICD-10-CM | POA: Diagnosis not present

## 2017-12-26 DIAGNOSIS — J181 Lobar pneumonia, unspecified organism: Secondary | ICD-10-CM

## 2017-12-26 DIAGNOSIS — J45909 Unspecified asthma, uncomplicated: Secondary | ICD-10-CM | POA: Insufficient documentation

## 2017-12-26 LAB — BASIC METABOLIC PANEL
Anion gap: 11 (ref 5–15)
BUN: 8 mg/dL (ref 6–20)
CHLORIDE: 100 mmol/L — AB (ref 101–111)
CO2: 26 mmol/L (ref 22–32)
CREATININE: 0.79 mg/dL (ref 0.44–1.00)
Calcium: 8.3 mg/dL — ABNORMAL LOW (ref 8.9–10.3)
GFR calc non Af Amer: >60 mL/min (ref >60)
Glucose, Bld: 115 mg/dL — ABNORMAL HIGH (ref 65–99)
Potassium: 3 mmol/L — ABNORMAL LOW (ref 3.5–5.1)
Sodium: 137 mmol/L (ref 135–145)

## 2017-12-26 LAB — URINALYSIS, ROUTINE W REFLEX MICROSCOPIC
BACTERIA UA: NONE SEEN
Glucose, UA: NEGATIVE mg/dL
HGB URINE DIPSTICK: NEGATIVE
Ketones, ur: NEGATIVE mg/dL
NITRITE: NEGATIVE
PROTEIN: 100 mg/dL — AB
Specific Gravity, Urine: 1.035 — ABNORMAL HIGH (ref 1.005–1.030)
pH: 5 (ref 5.0–8.0)

## 2017-12-26 LAB — CBC WITH DIFFERENTIAL/PLATELET
Basophils Absolute: 0 10*3/uL (ref 0.0–0.1)
Basophils Relative: 0 %
Eosinophils Absolute: 0.2 10*3/uL (ref 0.0–0.7)
Eosinophils Relative: 2 %
HEMATOCRIT: 34.7 % — AB (ref 36.0–46.0)
HEMOGLOBIN: 11.7 g/dL — AB (ref 12.0–15.0)
LYMPHS PCT: 18 %
Lymphs Abs: 1.7 10*3/uL (ref 0.7–4.0)
MCH: 31.2 pg (ref 26.0–34.0)
MCHC: 33.7 g/dL (ref 30.0–36.0)
MCV: 92.5 fL (ref 78.0–100.0)
MONOS PCT: 8 %
Monocytes Absolute: 0.8 10*3/uL (ref 0.1–1.0)
NEUTROS ABS: 7.1 10*3/uL (ref 1.7–7.7)
NEUTROS PCT: 72 %
Platelets: 160 10*3/uL (ref 150–400)
RBC: 3.75 MIL/uL — ABNORMAL LOW (ref 3.87–5.11)
RDW: 12.7 % (ref 11.5–15.5)
WBC: 9.8 10*3/uL (ref 4.0–10.5)

## 2017-12-26 LAB — PREGNANCY, URINE: PREG TEST UR: NEGATIVE

## 2017-12-26 MED ORDER — LEVOFLOXACIN 500 MG PO TABS: 500.0000 mg | ORAL_TABLET | Freq: Every day | ORAL | 0 refills | Status: AC

## 2017-12-26 MED ORDER — LEVOFLOXACIN 500 MG PO TABS
500.0000 mg | ORAL_TABLET | Freq: Once | ORAL | Status: AC
  Administered 2017-12-26: 500 mg via ORAL
  Filled 2017-12-26: qty 1

## 2017-12-26 MED ORDER — IOPAMIDOL (ISOVUE-300) INJECTION 61%
75.0000 mL | Freq: Once | INTRAVENOUS | Status: AC | PRN
  Administered 2017-12-26: 75 mL via INTRAVENOUS

## 2017-12-26 NOTE — ED Triage Notes (Signed)
Pt states she has not been feeling good since Monday with fever since Tuesday.  Saw pcp on Thursday and had negative flu swab but was treated anyway with Tamiflu.  Pt states she has a horrible cough with yellow sputum.  Took Ibuprofen 600mg  at 1300.

## 2017-12-26 NOTE — Discharge Instructions (Signed)
Continue using your albuterol inhaler as directed.  Alternate Tylenol and ibuprofen every 4-6 hours for fever.  Increase fluid intake.  Pump and dump breast milk while taking the antibiotic.  Follow-up with your primary provider for recheck in 1-2 weeks to ensure resolution of pneumonia.  Return to the ER for any worsening symptoms.

## 2017-12-26 NOTE — ED Notes (Signed)
Call to lab  

## 2017-12-26 NOTE — ED Provider Notes (Signed)
[facility]PART[facility] Provider Note   CSN: [id number] Arrival date & time: 12/26/17  1434     History   Chief Complaint Chief Complaint  Patient presents with  . Fever    HPI Jackquline BoschCourtney G Lehew is a 37 y.o. female.  HPI  Jackquline BoschCourtney G Helmers is a 37 y.o. female with hx of asthma, and is currently breastfeeding, who presents to the Emergency Department complaining of persistent cough, generalized body aches, and intermittent fever for four days.  Reports occasionally productive cough and max fever at home of 103.  Fever temporarily improves with tylenol and ibuprofen.  Chest/rib pain associated with cough.  She was seen by her PCP two days ago, had negative flu swab, but still treated with Tamiflu which has not provided relief.  She is also using albuterol inhaler at home without significant relief. She denies change in appetite,  shortness of breath, abdominal pain, vomiting or hemoptysis .   Past Medical History:  Diagnosis Date  . Allergic rhinitis   . Asthma   . Bronchitis   . Hypothyroidism     Patient Active Problem List   Diagnosis Date Noted  . SVD (spontaneous vaginal delivery) 07/09/2017  . Postpartum care following vaginal delivery 07/09/2017  . Delayed delivery after SROM (spontaneous rupture of membranes) 07/08/2017  . Cough 04/11/2009  . ALLERGIC RHINITIS 11/03/2008  . ASTHMA 11/03/2008    Past Surgical History:  Procedure Laterality Date  . TONSILLECTOMY    . WISDOM TOOTH EXTRACTION      OB History    Gravida Para Term Preterm AB Living   3 2 2   1 2    SAB TAB Ectopic Multiple Live Births   1     0 1       Home Medications    Prior to Admission medications   Medication Sig Start Date End Date Taking? Authorizing Provider  albuterol (PROVENTIL HFA;VENTOLIN HFA) 108 (90 BASE) MCG/ACT inhaler Inhale 2 puffs into the lungs every 6 (six) hours as needed for wheezing. 09/04/11 02/24/14  Oretha MilchAlva, Rakesh V, MD  escitalopram (LEXAPRO) 10 MG tablet  Take 10 mg by mouth daily.      [provider]  Fluticasone Furoate-Vilanterol (BREO ELLIPTA IN) Inhale into the lungs.    [provider]  ibuprofen (ADVIL,MOTRIN) 600 MG tablet Take 1 tablet (600 mg total) by mouth every 6 (six) hours as needed for moderate pain or cramping. 07/11/17   Banga, Cecilia Worema, DO  levofloxacin (LEVAQUIN) 500 MG tablet Take 1 tablet (500 mg total) by mouth daily. 12/26/17   Concetta Guion, PA-C  levothyroxine (SYNTHROID, LEVOTHROID) 50 MCG tablet Take 50 mcg by mouth daily before breakfast.  06/24/17   [provider]  Prenatal Vit w/Fe-Methylfol-FA (PNV PO) Take by mouth.    [provider]    Family History History reviewed. No pertinent family history.  Social History Social History   Tobacco Use  . Smoking status: Never Smoker  . Smokeless tobacco: Never Used  Substance Use Topics  . Alcohol use: No  . Drug use: No     Allergies   Allegra-d [fexofenadine-pseudoephed er]   Review of Systems Review of Systems  Constitutional: Positive for fever. Negative for appetite change and chills.  HENT: Positive for congestion. Negative for ear pain, sore throat and trouble swallowing.   Respiratory: Positive for cough and chest tightness. Negative for shortness of breath and wheezing.   Cardiovascular: Negative for chest pain (chest pain associated with  cough).  Gastrointestinal: Negative for abdominal pain, nausea and vomiting.  Genitourinary: Negative for dysuria.  Musculoskeletal: Positive for myalgias. Negative for arthralgias.  Skin: Negative for rash.  Neurological: Negative for dizziness, weakness, numbness and headaches.  Hematological: Negative for adenopathy.  All other systems reviewed and are negative.    Physical Exam Updated Vital Signs BP 104/60 (BP Location: Right Arm)   Pulse (!) 104 Comment: 104  Temp 98.3 F (36.8 C) (Oral)   Resp 20   Ht 5\' 6"  (1.676 m)   Wt 93 kg (205 lb)   SpO2 96%  Comment: walking down hall  Breastfeeding? Yes Comment: pump and bottle  BMI 33.09 kg/m   Physical Exam  Constitutional: She is oriented to person, place, and time. She appears well-developed and well-nourished. No distress.  HENT:  Head: Normocephalic and atraumatic.  Right Ear: Tympanic membrane and ear canal normal.  Left Ear: Tympanic membrane and ear canal normal.  Mouth/Throat: Uvula is midline, oropharynx is clear and moist and mucous membranes are normal. No oropharyngeal exudate.  Eyes: EOM are normal. Pupils are equal, round, and reactive to light.  Neck: Normal range of motion, full passive range of motion without pain and phonation normal. Neck supple.  Cardiovascular: Normal rate, regular rhythm and intact distal pulses.  No murmur heard. Pulmonary/Chest: Effort normal. No stridor. No respiratory distress. She has no rales. She exhibits no tenderness.  Few scattered rales.  No wheezes.  No respiratory distress  Abdominal: Soft. She exhibits no distension. There is no tenderness. There is no guarding.  Musculoskeletal: Normal range of motion. She exhibits no edema.  Lymphadenopathy:    She has no cervical adenopathy.  Neurological: She is alert and oriented to person, place, and time. She exhibits normal muscle tone. Coordination normal.  Skin: Skin is warm and dry.  Nursing note and vitals reviewed.    ED Treatments / Results  Labs (all labs ordered are listed, but only abnormal results are displayed) Labs Reviewed  CBC WITH DIFFERENTIAL/PLATELET - Abnormal; Notable for the following components:      Result Value   RBC 3.75 (*)    Hemoglobin 11.7 (*)    HCT 34.7 (*)    All other components within normal limits  BASIC METABOLIC PANEL - Abnormal; Notable for the following components:   Potassium 3.0 (*)    Chloride 100 (*)    Glucose, Bld 115 (*)    Calcium 8.3 (*)    All other components within normal limits  URINALYSIS, ROUTINE W REFLEX MICROSCOPIC - Abnormal;  Notable for the following components:   Color, Urine AMBER (*)    APPearance HAZY (*)    Specific Gravity, Urine 1.035 (*)    Bilirubin Urine SMALL (*)    Protein, ur 100 (*)    Leukocytes, UA TRACE (*)    Squamous Epithelial / LPF 6-30 (*)    All other components within normal limits  PREGNANCY, URINE    EKG  EKG Interpretation None       Radiology Dg Chest 2 View  Result Date: 12/26/2017 CLINICAL DATA:  Patient with cough and fever for 6 days. EXAM: CHEST  2 VIEW COMPARISON:  Chest radiograph 08/25/2016 FINDINGS: Normal cardiac contours. Masslike nodular opacity within the right hilar location. Consolidation within the left lower hemithorax. No pleural effusion or pneumothorax. Thoracic spine degenerative changes. IMPRESSION: 1. Masslike nodular opacity within the right hilar location. Recommend further evaluation with contrast-enhanced CT as hilar mass is not excluded. 2. Consolidative  opacities within the left lower lung concerning for pneumonia in the appropriate clinical setting. Electronically Signed   By: Annia Belt M.D.   On: 12/26/2017 15:16   Ct Chest W Contrast  Result Date: 12/26/2017 CLINICAL DATA:  Feeling poorly since Monday. Fever on Tuesday. Productive cough with yellow sputum. EXAM: CT CHEST WITH CONTRAST TECHNIQUE: Multidetector CT imaging of the chest was performed during intravenous contrast administration. CONTRAST:  75mL ISOVUE-300 IOPAMIDOL (ISOVUE-300) INJECTION 61% COMPARISON:  Chest CT from earlier today FINDINGS: Cardiovascular: Normal heart size. No pericardial effusion. No acute vascular finding. Mediastinum/Nodes: Adenopathy mainly affecting the hila, greater on the right. This correlates with radiographic findings previously. A right hilar node measures up to 16 mm short axis. In this clinical setting these are presumably reactive. Lungs/Pleura: Clusters of centrilobular nodules and small to moderate consolidative opacities affecting all lobes. There is  generalized airway thickening. No cavitation or effusion. Upper Abdomen: Probable hepatic steatosis certainty diminished by contrast timing. Musculoskeletal: No acute or aggressive finding. IMPRESSION: 1. Pan lobar pneumonia.Right hilar abnormality on previous chest x-ray reflects adenopathy that is presumably reactive in this setting. Followup PA and lateral chest X-ray is recommended in 3-4 weeks following trial of antibiotic therapy to ensure resolution. 2. Probable hepatic steatosis. Electronically Signed   By: Marnee Spring M.D.   On: 12/26/2017 16:39    Procedures Procedures (including critical care time)  Medications Ordered in ED Medications  iopamidol (ISOVUE-300) 61 % injection 75 mL (75 mLs Intravenous Contrast Given 12/26/17 1608)  levofloxacin (LEVAQUIN) tablet 500 mg (500 mg Oral Given 12/26/17 1718)     Initial Impression / Assessment and Plan / ED Course  I have reviewed the triage vital signs and the nursing notes.  Pertinent labs & imaging results that were available during my care of the patient were reviewed by me and considered in my medical decision making (see chart for details).    Pt non- toxic appearing.  No hypoxia, persistent fever or leukocytosis .  No respiratory distress noted.  Lobar PNA.     Pt appears safe for d/c home, has ambulated in the dept w/o hypoxia.  Tolerating fluids.  Hypokalemia felt to be secondary to frequent albuterol.  Care plan discussed with Dr. Clayborne Dana prior to d/c.  Pt agrees to tx plan with Levaquin, will continue albuterol MDI, increased po fluids and emphasized strict return precautions and PCP f/u with probable repeat imaging in 2-3 weeks to ensure resolution.  Pt verbalized understanding.  Final Clinical Impressions(s) / ED Diagnoses   Final diagnoses:  Lobar pneumonia [facility])    ED Discharge Orders        Ordered    levofloxacin (LEVAQUIN) 500 MG tablet  Daily     12/26/17 1703       Pauline Aus, PA-C 12/26/17 2209      Mesner, Barbara Cower, MD 12/26/17 2247

## 2017-12-26 NOTE — ED Notes (Signed)
Per pt and pt mother:  Pt has had no flu shot- URI sx x 1 week  Seen this week at PCP negative flu swabs, Rx for tamiflu  Pt continues with cough

## 2017-12-26 NOTE — ED Notes (Signed)
TT in to discuss and reassess

## 2020-04-27 ENCOUNTER — Other Ambulatory Visit: Payer: Self-pay | Admitting: Obstetrics and Gynecology

## 2020-04-27 DIAGNOSIS — N6310 Unspecified lump in the right breast, unspecified quadrant: Secondary | ICD-10-CM

## 2020-05-15 ENCOUNTER — Other Ambulatory Visit: Payer: BC Managed Care – PPO

## 2020-05-22 ENCOUNTER — Other Ambulatory Visit: Payer: Self-pay

## 2020-05-22 ENCOUNTER — Ambulatory Visit
Admission: RE | Admit: 2020-05-22 | Discharge: 2020-05-22 | Disposition: A | Discharge status: Home / Self Care | Payer: BC Managed Care – PPO | Source: Ambulatory Visit | Attending: Obstetrics and Gynecology | Admitting: Obstetrics and Gynecology

## 2020-05-22 DIAGNOSIS — N6310 Unspecified lump in the right breast, unspecified quadrant: Secondary | ICD-10-CM

## 2020-08-03 IMAGING — MG DIGITAL DIAGNOSTIC BILAT W/ TOMO W/ CAD
6 of 10 series · 6 of 30 positions shown · non-contrast
Comparison: None.

CLINICAL DATA: Patient reports a palpable lump the lateral aspect
of the right breast.

EXAM:
DIGITAL DIAGNOSTIC BILATERAL MAMMOGRAM WITH CAD AND TOMO
ULTRASOUND RIGHT BREAST

[R TAN synth-2D]
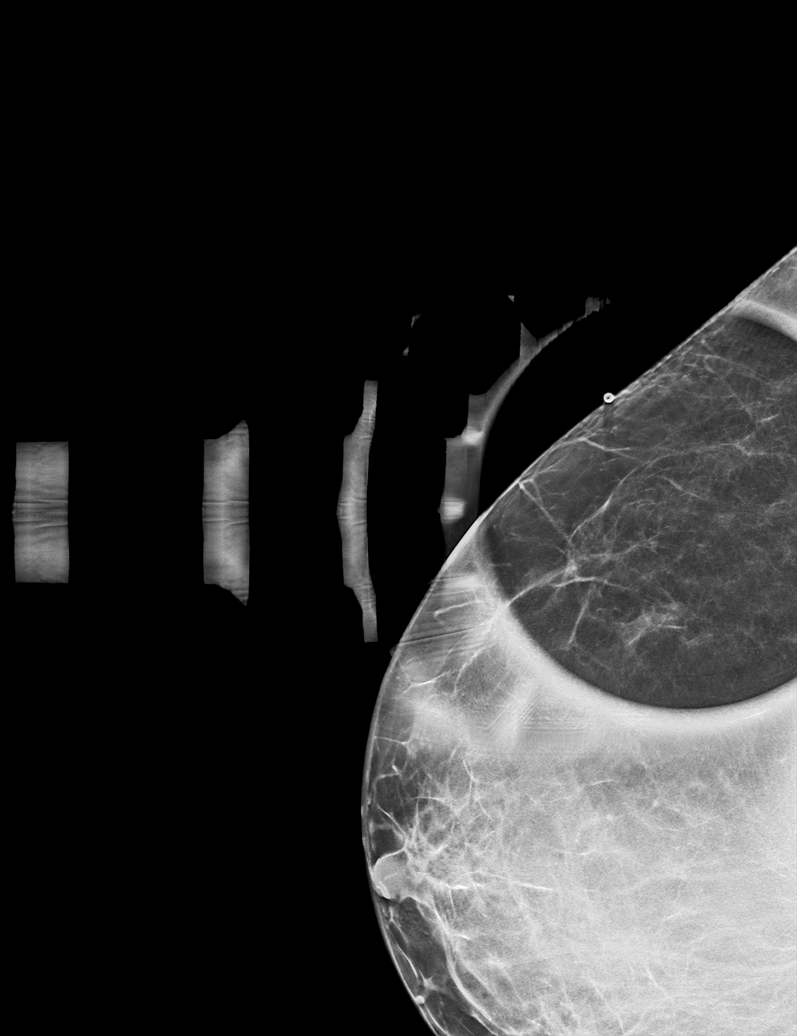

[L MLO synth-2D]
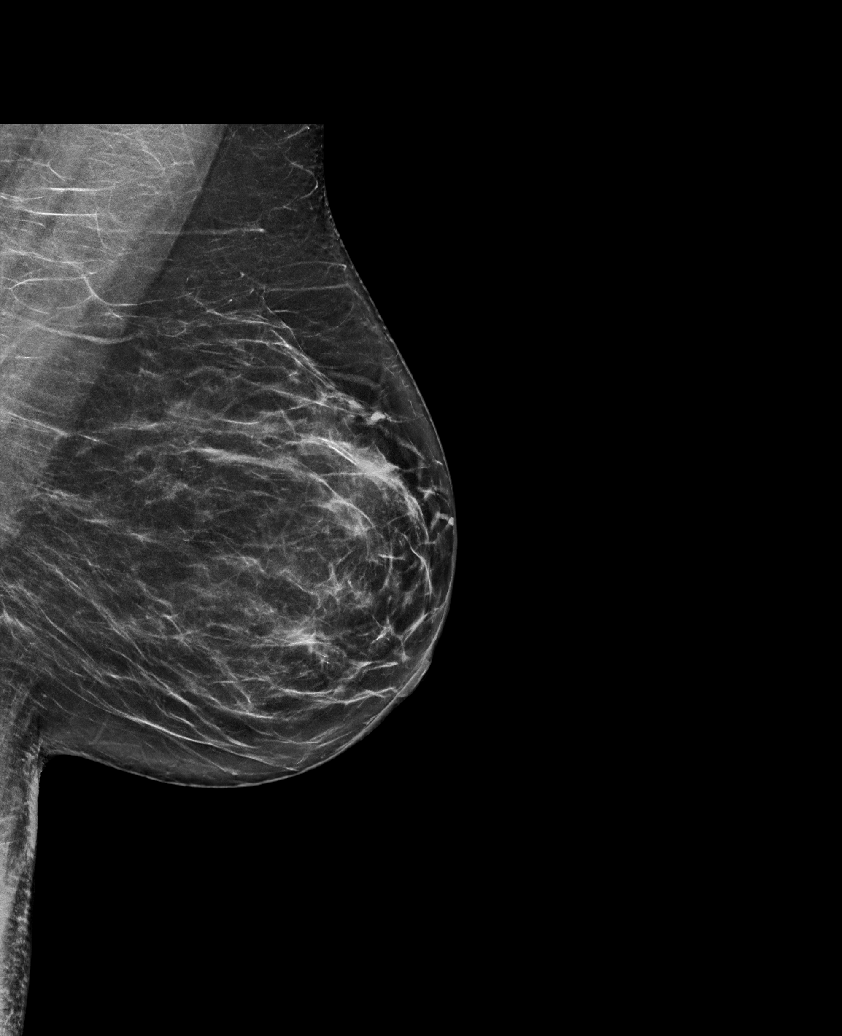

[L CC synth-2D]
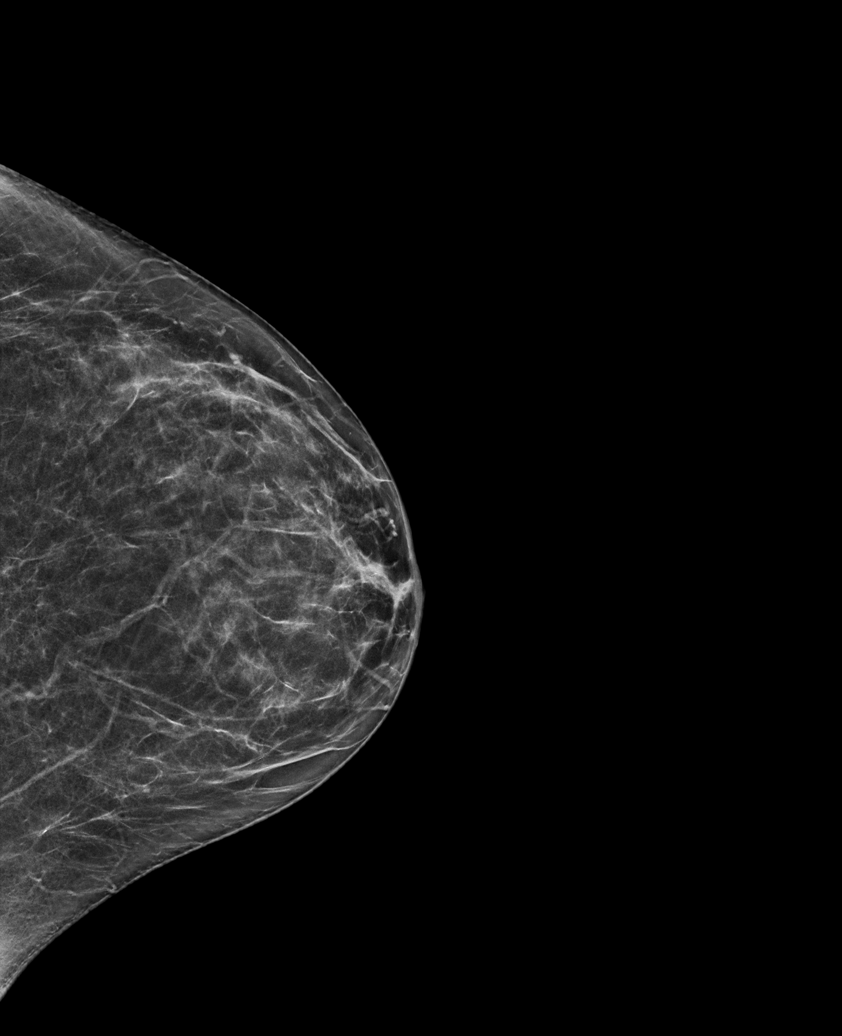

[R MLO synth-2D]
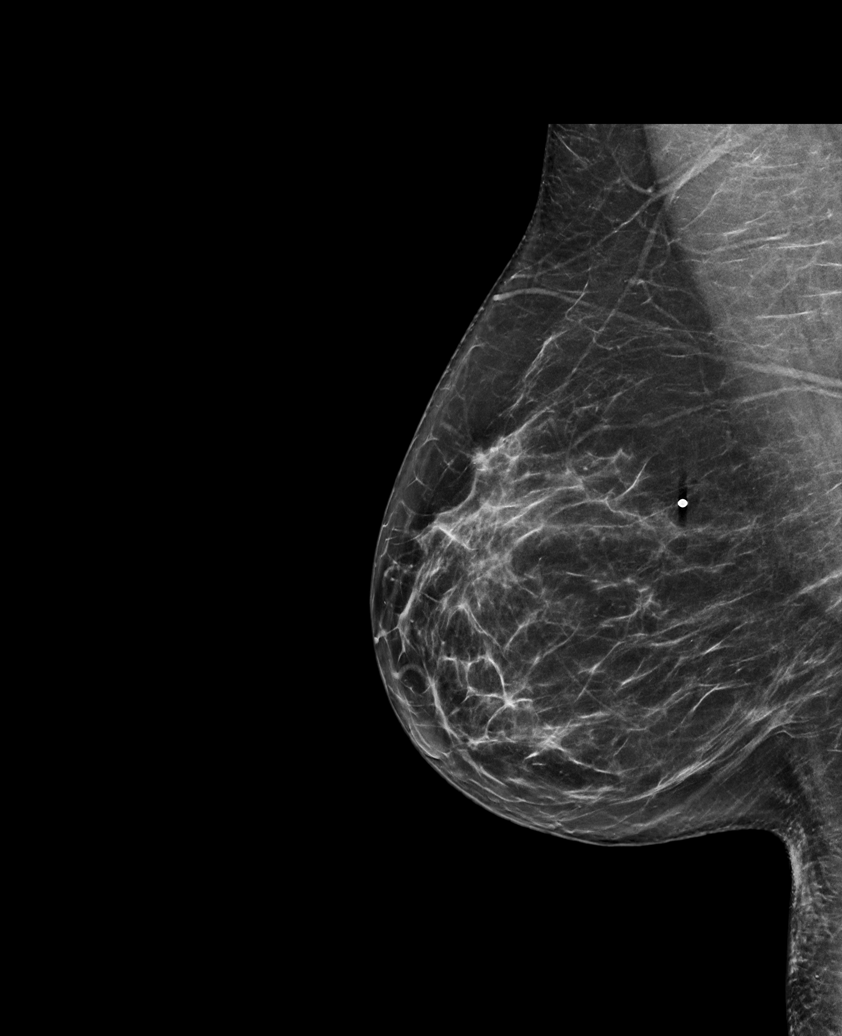

[R CC synth-2D]
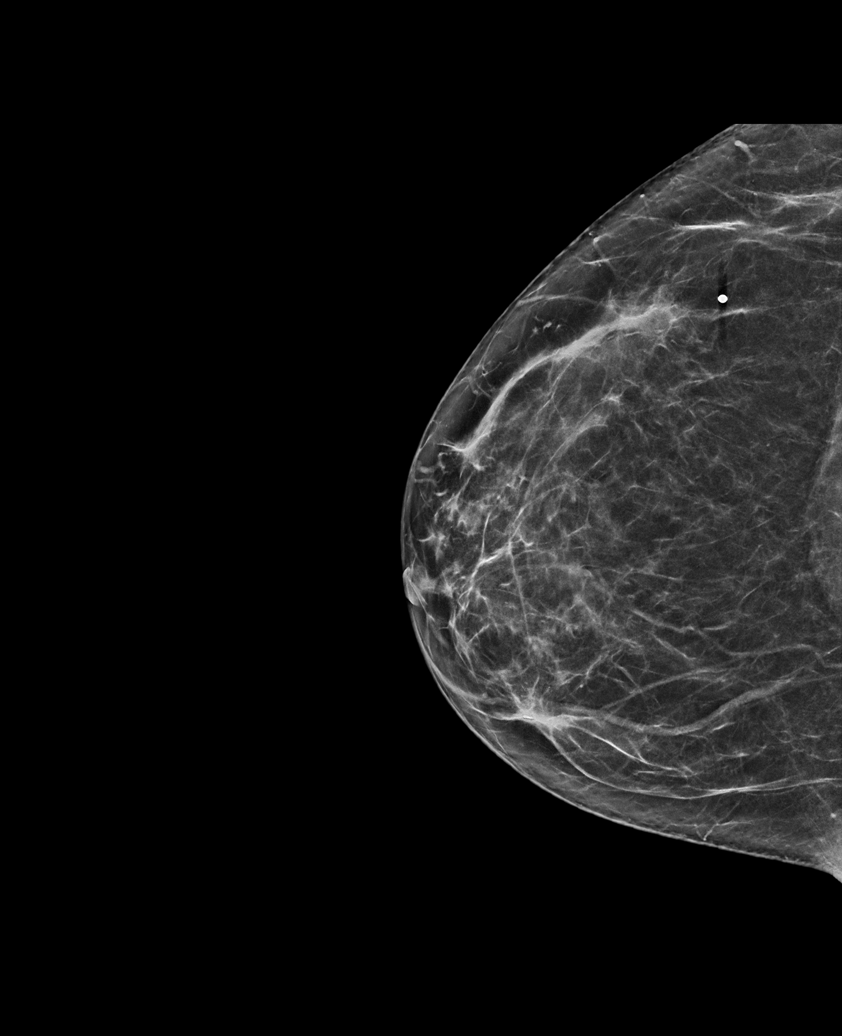

[L CC tomo · tomo slice 31/61.0]
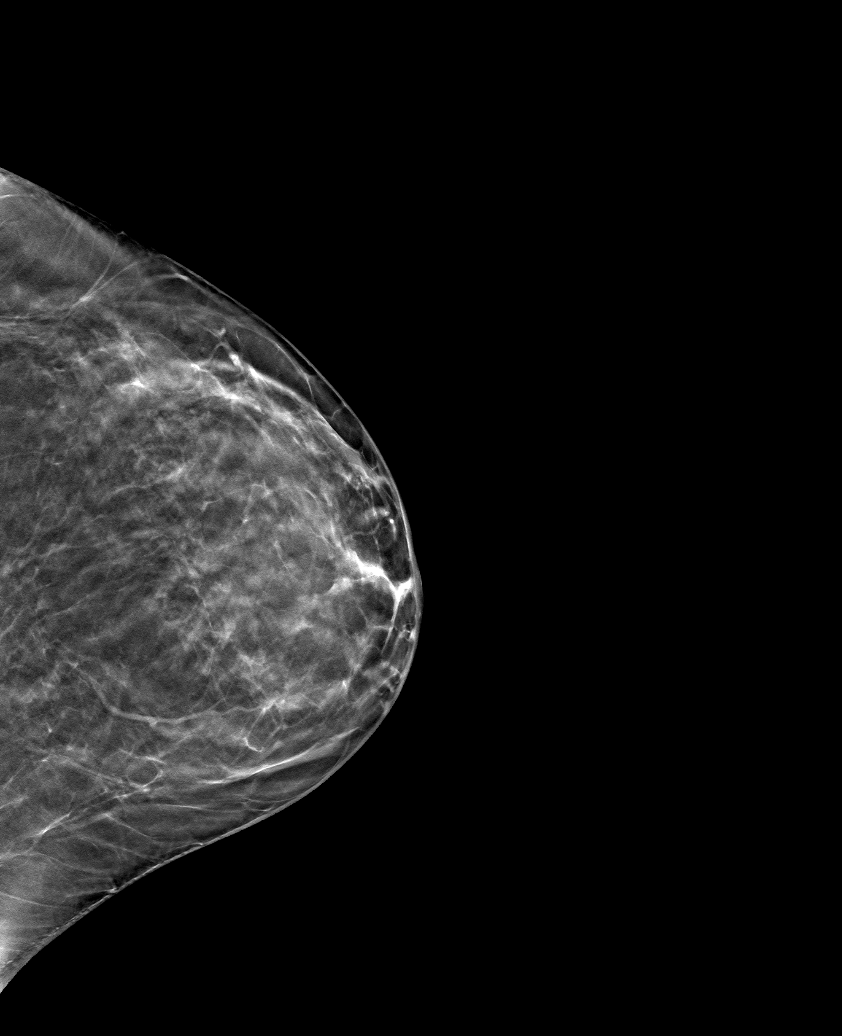

[6 of 30 positions shown; findings below may reference images not displayed]

ACR Breast Density Category b: There are scattered areas of
fibroglandular density.
FINDINGS: There are no masses, areas of architectural distortion, areas of
significant asymmetry or suspicious calcifications.

Mammographic images were processed with CAD.

On physical exam, there is a lumpy texture to the tissue in the
lateral right breast without a defined mass.

Targeted ultrasound is performed, showing normal fibroglandular
tissue along the lateral aspect of the right breast. No mass or
suspicious lesion.
IMPRESSION: Negative exam.  No evidence of breast malignancy.

RECOMMENDATION:
Screening mammogram at age 41 unless there are persistent or
intervening clinical concerns. (Code:NH-2-OZL)

I have discussed the findings and recommendations with the patient.
If applicable, a reminder letter will be sent to the patient
regarding the next appointment.

BI-RADS CATEGORY  1: Negative.

## 2020-08-03 IMAGING — US US BREAST*R* LIMITED INC AXILLA
1 series · 4 of 4 positions shown · non-contrast
Comparison: None.

CLINICAL DATA: Patient reports a palpable lump the lateral aspect
of the right breast.

EXAM:
DIGITAL DIAGNOSTIC BILATERAL MAMMOGRAM WITH CAD AND TOMO
ULTRASOUND RIGHT BREAST

[Series 1: us breast*right* limited inc axilla · 0.06mm/px · 4 of 4 slices shown]
[im 1/4]
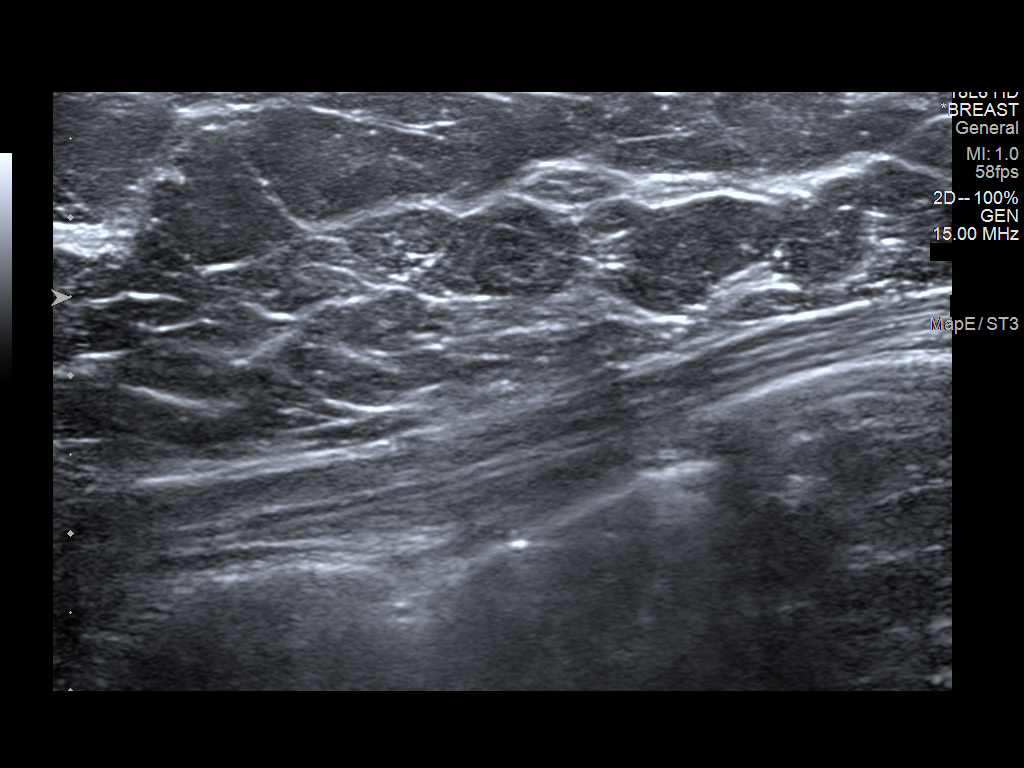
[im 2/4]
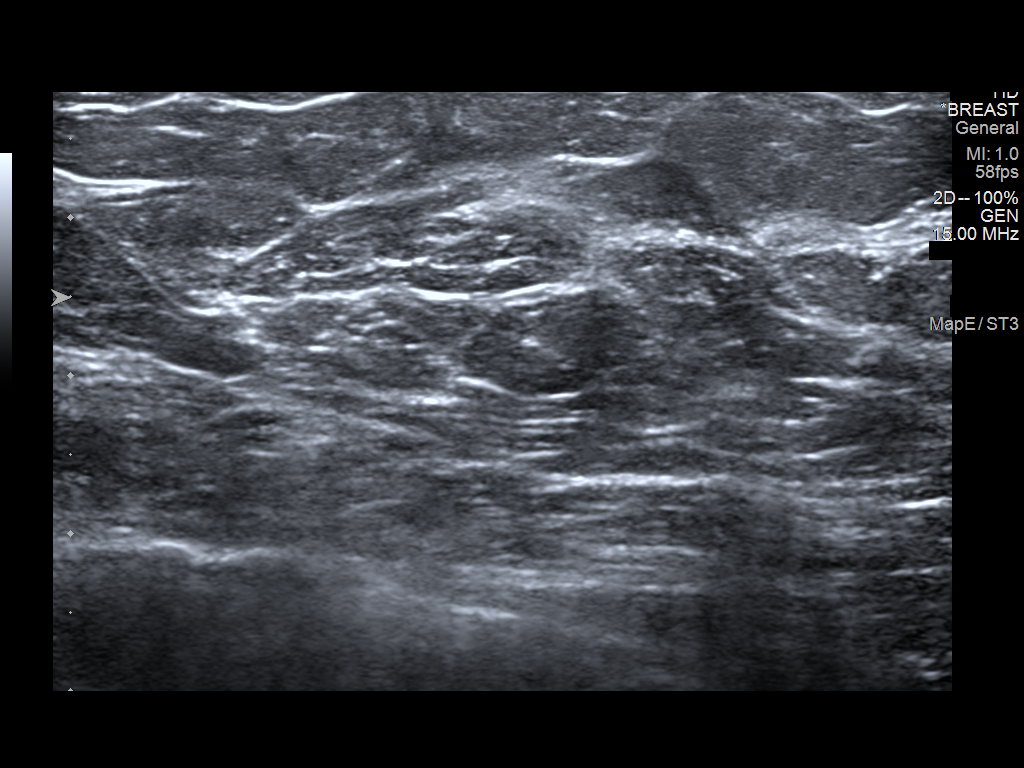
[im 3/4]
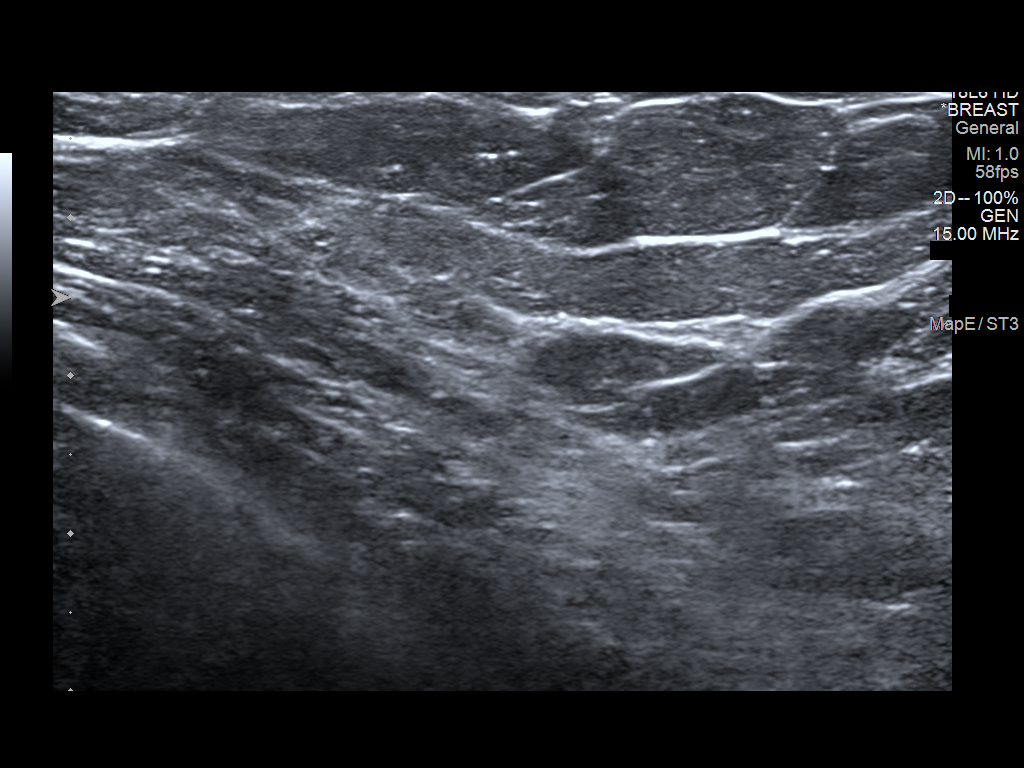
[im 4/4]
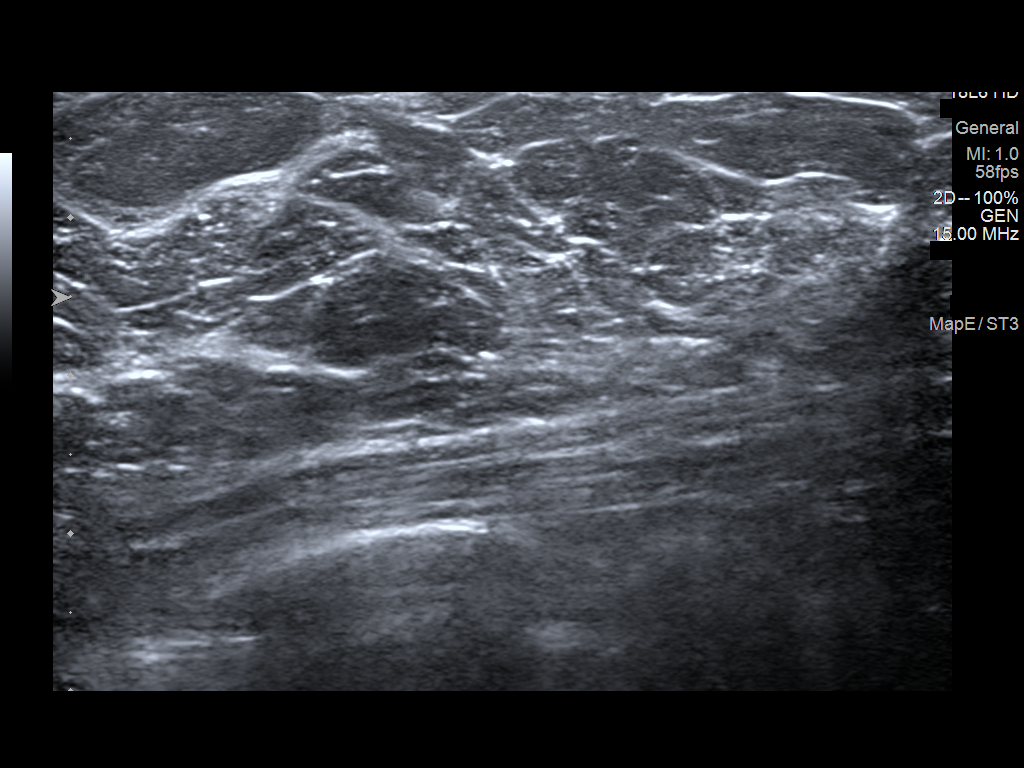

[4 of 4 positions shown; findings below may reference images not displayed]

ACR Breast Density Category b: There are scattered areas of
fibroglandular density.
FINDINGS: There are no masses, areas of architectural distortion, areas of
significant asymmetry or suspicious calcifications.

Mammographic images were processed with CAD.

On physical exam, there is a lumpy texture to the tissue in the
lateral right breast without a defined mass.

Targeted ultrasound is performed, showing normal fibroglandular
tissue along the lateral aspect of the right breast. No mass or
suspicious lesion.
IMPRESSION: Negative exam.  No evidence of breast malignancy.

RECOMMENDATION:
Screening mammogram at age 41 unless there are persistent or
intervening clinical concerns. (Code:NH-2-OZL)

I have discussed the findings and recommendations with the patient.
If applicable, a reminder letter will be sent to the patient
regarding the next appointment.

BI-RADS CATEGORY  1: Negative.

## 2023-10-06 ENCOUNTER — Other Ambulatory Visit: Payer: Self-pay | Admitting: Obstetrics and Gynecology

## 2023-10-06 DIAGNOSIS — Z1231 Encounter for screening mammogram for malignant neoplasm of breast: Secondary | ICD-10-CM

## 2023-10-28 ENCOUNTER — Ambulatory Visit
Admission: RE | Admit: 2023-10-28 | Discharge: 2023-10-28 | Disposition: A | Discharge status: Home / Self Care | Payer: BC Managed Care – PPO | Source: Ambulatory Visit | Attending: Obstetrics and Gynecology

## 2023-10-28 DIAGNOSIS — Z1231 Encounter for screening mammogram for malignant neoplasm of breast: Secondary | ICD-10-CM
